# Patient Record
Sex: Female | Born: 1985 | Race: Asian | Hispanic: No | Marital: Married | Smoking: Never smoker
Health system: Southern US, Community
[De-identification: ages and names within clinical notes are randomized; demographics above are authoritative.]

## PROBLEM LIST (undated history)

## (undated) ENCOUNTER — Inpatient Hospital Stay (HOSPITAL_COMMUNITY): Payer: Self-pay

## (undated) DIAGNOSIS — Z789 Other specified health status: Secondary | ICD-10-CM

## (undated) HISTORY — PX: NO PAST SURGERIES: SHX2092

---

## 2012-07-21 LAB — OB RESULTS CONSOLE GC/CHLAMYDIA: Gonorrhea: NEGATIVE

## 2012-07-21 LAB — OB RESULTS CONSOLE VARICELLA ZOSTER ANTIBODY, IGG: Varicella: IMMUNE

## 2012-07-21 LAB — OB RESULTS CONSOLE HIV ANTIBODY (ROUTINE TESTING): HIV: NONREACTIVE

## 2012-07-23 NOTE — L&D Delivery Note (Signed)
Agree with above note.  Leavy Heatherly H. 12/04/2012 2:06 PM

## 2012-07-23 NOTE — L&D Delivery Note (Signed)
Delivery Note Pt progressed quickly to complete and pushed well and at 3:01 AM a viable female was delivered via Vaginal, Spontaneous Delivery (Presentation: Right Occiput Anterior).  APGAR: 8, 9; weight: pending.  Infant dried and lifted to pt's abd. Cord clamped and cut by FOB. Hospital cord blood sample collected. Placenta status: Intact, Spontaneous.  Cord: 3 vessels.  Anesthesia: Local  Episiotomy: None Lacerations: 1st degree;Vaginal Suture Repair: 3.0 vicryl Est. Blood Loss (mL): 250  Mom to postpartum.  Baby to nursery-stable.  Amber Le 11/29/2012, 3:30 AM

## 2012-07-31 ENCOUNTER — Other Ambulatory Visit (HOSPITAL_COMMUNITY): Payer: Self-pay | Admitting: Family

## 2012-07-31 DIAGNOSIS — Z0489 Encounter for examination and observation for other specified reasons: Secondary | ICD-10-CM

## 2012-08-01 ENCOUNTER — Ambulatory Visit (HOSPITAL_COMMUNITY)
Admission: RE | Admit: 2012-08-01 | Discharge: 2012-08-01 | Disposition: A | Discharge status: Home / Self Care | Payer: Medicaid Other | Source: Ambulatory Visit | Attending: Family | Admitting: Family

## 2012-08-01 ENCOUNTER — Encounter (HOSPITAL_COMMUNITY): Payer: Self-pay

## 2012-08-01 DIAGNOSIS — O358XX0 Maternal care for other (suspected) fetal abnormality and damage, not applicable or unspecified: Secondary | ICD-10-CM | POA: Insufficient documentation

## 2012-08-01 DIAGNOSIS — O0990 Supervision of high risk pregnancy, unspecified, unspecified trimester: Secondary | ICD-10-CM | POA: Insufficient documentation

## 2012-08-01 DIAGNOSIS — Z0489 Encounter for examination and observation for other specified reasons: Secondary | ICD-10-CM

## 2012-08-01 DIAGNOSIS — Z1389 Encounter for screening for other disorder: Secondary | ICD-10-CM | POA: Insufficient documentation

## 2012-08-01 DIAGNOSIS — Z363 Encounter for antenatal screening for malformations: Secondary | ICD-10-CM | POA: Insufficient documentation

## 2012-08-01 NOTE — Progress Notes (Signed)
Genetic Counseling  High-Risk Gestation Note  Appointment Date:  08/01/2012 Referred By: Jerrell Belfast, FNP Date of Birth:  1986/07/21    Pregnancy History: G1P0 Estimated Date of Delivery: 11/28/12 Estimated Gestational Age: [redacted]w[redacted]d Attending: Rema Fendt, MD  I met with Amber Le for genetic counseling because of a paternal age of 29 years. Interpretation was provided by [facility] interpreter Amber Le.  Both family histories were reviewed in detail and were contributory for the patient's paternal uncle being unable to speak. He reportedly has normal hearing and is able to communicate with gestures. He reportedly has never been able to speak and an underlying cause is not known. No additional relatives were reported with absent speech, including his children. We discussed that absent speech can be one feature of an underlying genetic condition. In the case of an underlying genetic cause, recurrence risk for relatives would depend upon the pattern of inheritance. Recurrence risk for the current pregnancy would likely be low given the degree of relation and no additional affected relatives. However, an accurate recurrence risk assessment cannot be provided without additional information regarding the underlying cause for her uncle's absent speech. The family histories were otherwise unremarkable for birth defects, mental retardation, known genetic conditions, recurrent pregnancy loss, or consanguinity.  Without further information regarding the provided family history, an accurate genetic risk cannot be calculated. Further genetic counseling is warranted if more information is obtained.  Amber Le was counseled that advanced paternal age (APA) is defined as paternal age greater than or equal to age 71.  Recent large-scale sequencing studies have shown that approximately 80% of de novo point mutations are of paternal origin.  Many studies have demonstrated a strong correlation between increased paternal  age and de novo point mutations.  Although no specific data is available regarding fetal risks for fathers 76+ years old at conception, it is apparent that the overall risk for single gene conditions is increased.  To estimate the relative increase in risk of a genetic disorder with APA, the heritability of the disease must be considered.  Assuming an approximate 2x increase in risk for conditions that are exclusively paternal in origin, the risk for each individual condition is still relatively low. We discussed that it is estimated that the overall chance for a de novo mutation is ~0.5%.  We also discussed the wide range of conditions which can be caused by new dominant gene mutations (achondroplasia, neurofibromatosis, Marfan syndrome etc.). She was counseled that genetic testing for each individual single gene condition is not warranted or available unless ultrasound or family history concerns lend suspicion to a specific condition.    In addition, she was counseled that some literature suggests that APA is also associated with an increase in risk for fetal aneuploidy.  While other literature does not support this, we discussed that a specific risk for aneuploidy other than that based on maternal age cannot be quantified. We reviewed that given the patient's age alone, the pregnancy would not be considered to be at increased risk for fetal aneuploidy. We discussed available screening and diagnostic options for fetal aneuploidy.  Specifically, we discussed the option of noninvasive prenatal testing (NIPT) and targeted ultrasound.  We discussed that NIPT utilizes cell free fetal DNA found in the maternal circulation. This test is not diagnostic for chromosome conditions, but can provide information regarding the presence or absence of extra fetal DNA for chromosomes 13, 18 and 21. Thus, it would not identify or rule out all genetic conditions. The  reported detection rate is greater than 99% for Trisomy 21,  greater than 97% for Trisomy 18, and is approximately 80% (8 out of 10) for Trisomy 13. The false positive rate is thought to be less than 0.1% for any of these conditions.  In addition, we discussed that ~50-80% of fetuses with Down syndrome and up to 90-95% of fetuses with trisomy 18/13, when well visualized, have detectable anomalies or soft markers by detailed ultrasound (~18+ weeks gestation).   After careful consideration, Amber Le elected to proceed with targeted ultrasound only and declined NIPT. Targeted ultrasound was performed at the time of today's visit. Visualized fetal anatomy appeared normal. Complete ultrasound results reported separately.   Amber Le denied exposure to environmental toxins or chemical agents. She denied the use of alcohol, tobacco or street drugs. She denied significant viral illnesses during the course of her pregnancy. Her medical and surgical histories were noncontributory.   I counseled Amber Le for approximately 30 minutes regarding the above risks and available options.    Amber Plowman, MS Certified Genetic Counselor 08/01/2012

## 2012-08-01 NOTE — Progress Notes (Signed)
Amber Le was seen for ultrasound appointment today.  Please see [facility] report for details.

## 2012-08-04 NOTE — Addendum Note (Signed)
Encounter addended by: Kadian Barcellos E Josimar Corning, RN on: 08/04/2012 10:12 AM<BR>     Documentation filed: Charges VN

## 2012-08-18 LAB — OB RESULTS CONSOLE ABO/RH

## 2012-08-18 LAB — OB RESULTS CONSOLE ANTIBODY SCREEN: Antibody Screen: NEGATIVE

## 2012-11-01 LAB — OB RESULTS CONSOLE GBS: GBS: NEGATIVE

## 2012-11-28 ENCOUNTER — Inpatient Hospital Stay (HOSPITAL_COMMUNITY)
Admission: AD | Admit: 2012-11-28 | Discharge: 2012-12-01 | DRG: 775 | Disposition: A | Discharge status: Home / Self Care | Payer: Medicaid Other | Source: Ambulatory Visit | Attending: Obstetrics & Gynecology | Admitting: Obstetrics & Gynecology

## 2012-11-28 HISTORY — DX: Other specified health status: Z78.9

## 2012-11-28 NOTE — MAU Note (Signed)
Contractions since 1000. Some pink vag d/c. Leaking fld all day.

## 2012-11-29 ENCOUNTER — Encounter (HOSPITAL_COMMUNITY): Payer: Self-pay | Admitting: *Deleted

## 2012-11-29 ENCOUNTER — Inpatient Hospital Stay (HOSPITAL_COMMUNITY): Admission: AD | Admit: 2012-11-29 | Payer: Self-pay | Source: Ambulatory Visit | Admitting: Obstetrics & Gynecology

## 2012-11-29 LAB — CBC
Hemoglobin: 13.3 g/dL (ref 12.0–15.0)
MCH: 27.7 pg (ref 26.0–34.0)
RBC: 4.81 MIL/uL (ref 3.87–5.11)
WBC: 13.7 10*3/uL — ABNORMAL HIGH (ref 4.0–10.5)

## 2012-11-29 LAB — TYPE AND SCREEN: Antibody Screen: NEGATIVE

## 2012-11-29 LAB — RPR: RPR Ser Ql: NONREACTIVE

## 2012-11-29 MED ORDER — FENTANYL CITRATE 0.05 MG/ML IJ SOLN
100.0000 ug | INTRAMUSCULAR | Status: DC | PRN
  Administered 2012-11-29: 100 ug via INTRAVENOUS
  Filled 2012-11-29: qty 2

## 2012-11-29 MED ORDER — DIBUCAINE 1 % RE OINT: 1.0000 "application " | TOPICAL_OINTMENT | RECTAL | Status: DC | PRN

## 2012-11-29 MED ORDER — NALBUPHINE SYRINGE 5 MG/0.5 ML
10.0000 mg | INJECTION | INTRAMUSCULAR | Status: DC | PRN
  Filled 2012-11-29: qty 1

## 2012-11-29 MED ORDER — BENZOCAINE-MENTHOL 20-0.5 % EX AERO: 1.0000 "application " | INHALATION_SPRAY | CUTANEOUS | Status: DC | PRN

## 2012-11-29 MED ORDER — DIPHENHYDRAMINE HCL 25 MG PO CAPS: 25.0000 mg | ORAL_CAPSULE | Freq: Four times a day (QID) | ORAL | Status: DC | PRN

## 2012-11-29 MED ORDER — ZOLPIDEM TARTRATE 5 MG PO TABS: 5.0000 mg | ORAL_TABLET | Freq: Every evening | ORAL | Status: DC | PRN

## 2012-11-29 MED ORDER — IBUPROFEN 600 MG PO TABS
600.0000 mg | ORAL_TABLET | Freq: Four times a day (QID) | ORAL | Status: DC | PRN
  Administered 2012-11-29: 600 mg via ORAL
  Filled 2012-11-29: qty 1

## 2012-11-29 MED ORDER — CITRIC ACID-SODIUM CITRATE 334-500 MG/5ML PO SOLN: 30.0000 mL | ORAL | Status: DC | PRN

## 2012-11-29 MED ORDER — OXYTOCIN BOLUS FROM INFUSION
500.0000 mL | INTRAVENOUS | Status: DC
  Administered 2012-11-29: 500 mL via INTRAVENOUS

## 2012-11-29 MED ORDER — LACTATED RINGERS IV SOLN
INTRAVENOUS | Status: DC
  Administered 2012-11-29: 125 mL/h via INTRAVENOUS

## 2012-11-29 MED ORDER — ONDANSETRON HCL 4 MG PO TABS: 4.0000 mg | ORAL_TABLET | ORAL | Status: DC | PRN

## 2012-11-29 MED ORDER — FLEET ENEMA 7-19 GM/118ML RE ENEM: 1.0000 | ENEMA | RECTAL | Status: DC | PRN

## 2012-11-29 MED ORDER — OXYCODONE-ACETAMINOPHEN 5-325 MG PO TABS
1.0000 | ORAL_TABLET | ORAL | Status: DC | PRN
Start: 2012-11-29 — End: 2012-12-01

## 2012-11-29 MED ORDER — SIMETHICONE 80 MG PO CHEW: 80.0000 mg | CHEWABLE_TABLET | ORAL | Status: DC | PRN

## 2012-11-29 MED ORDER — ONDANSETRON HCL 4 MG/2ML IJ SOLN: 4.0000 mg | INTRAMUSCULAR | Status: DC | PRN

## 2012-11-29 MED ORDER — ACETAMINOPHEN 325 MG PO TABS: 650.0000 mg | ORAL_TABLET | ORAL | Status: DC | PRN

## 2012-11-29 MED ORDER — PRENATAL MULTIVITAMIN CH
1.0000 | ORAL_TABLET | Freq: Every day | ORAL | Status: DC
  Administered 2012-11-29 – 2012-11-30 (×2): 1 via ORAL
  Filled 2012-11-29 (×3): qty 1

## 2012-11-29 MED ORDER — TETANUS-DIPHTH-ACELL PERTUSSIS 5-2.5-18.5 LF-MCG/0.5 IM SUSP: 0.5000 mL | Freq: Once | INTRAMUSCULAR | Status: DC

## 2012-11-29 MED ORDER — OXYCODONE-ACETAMINOPHEN 5-325 MG PO TABS
1.0000 | ORAL_TABLET | ORAL | Status: DC | PRN
  Administered 2012-11-29: 1 via ORAL
  Filled 2012-11-29: qty 1

## 2012-11-29 MED ORDER — LACTATED RINGERS IV SOLN: 500.0000 mL | INTRAVENOUS | Status: DC | PRN

## 2012-11-29 MED ORDER — WITCH HAZEL-GLYCERIN EX PADS: 1.0000 "application " | MEDICATED_PAD | CUTANEOUS | Status: DC | PRN

## 2012-11-29 MED ORDER — ONDANSETRON HCL 4 MG/2ML IJ SOLN: 4.0000 mg | Freq: Four times a day (QID) | INTRAMUSCULAR | Status: DC | PRN

## 2012-11-29 MED ORDER — IBUPROFEN 600 MG PO TABS
600.0000 mg | ORAL_TABLET | Freq: Four times a day (QID) | ORAL | Status: DC
  Administered 2012-11-29 – 2012-12-01 (×8): 600 mg via ORAL
  Filled 2012-11-29 (×9): qty 1

## 2012-11-29 MED ORDER — SENNOSIDES-DOCUSATE SODIUM 8.6-50 MG PO TABS
2.0000 | ORAL_TABLET | Freq: Every day | ORAL | Status: DC
  Administered 2012-11-29 – 2012-11-30 (×2): 2 via ORAL

## 2012-11-29 MED ORDER — LIDOCAINE HCL (PF) 1 % IJ SOLN
30.0000 mL | INTRAMUSCULAR | Status: AC | PRN
  Administered 2012-11-29: 30 mL via SUBCUTANEOUS
  Filled 2012-11-29 (×2): qty 30

## 2012-11-29 MED ORDER — OXYTOCIN 40 UNITS IN LACTATED RINGERS INFUSION - SIMPLE MED
62.5000 mL/h | INTRAVENOUS | Status: DC
  Filled 2012-11-29: qty 1000

## 2012-11-29 MED ORDER — LANOLIN HYDROUS EX OINT: TOPICAL_OINTMENT | CUTANEOUS | Status: DC | PRN

## 2012-11-29 NOTE — Progress Notes (Signed)
Report called to Dana RN in BS. Pt to BS via w/c 

## 2012-11-29 NOTE — H&P (Signed)
Amber Le is a 27 y.o. female G1P0 at 39.1wks presenting for eval of ctx and possible leaking fluid. Denies N/V/D or any other complaints. She receives her Amber [facility] at the [facility] and her pregnancy has been remarkable for 1) recently moved here from [place] 2) paternal age 53- rec'd genetic counseling but declined testing 3) late to care at 25wks 4) GBS neg History OB History   Grav Para Term Preterm Abortions TAB SAB Ect Mult Living   1              Past Medical History  Diagnosis Date  . Medical history non-contributory    History reviewed. No pertinent past surgical history. Family History: family history is negative for Alcohol abuse. Social History:  reports that she has never smoked. She does not have any smokeless tobacco history on file. She reports that she does not drink alcohol or use illicit drugs.   Prenatal Transfer Tool  Maternal Diabetes: No Genetic Screening: Declined Maternal Ultrasounds/Referrals: Normal Fetal Ultrasounds or other Referrals:  None Maternal Substance Abuse:  No Significant Maternal Medications:  None Significant Maternal Lab Results:  Lab values include: Group B Strep negative Other Comments:  None  ROS  Dilation: 4 Effacement (%): 90 Station: -1 Exam by:: Amber Le CNM Blood pressure 128/87, pulse 89, resp. rate 20, height 4' 11.5" (1.511 m), weight 164 lb 13.9 oz (74.785 kg), last menstrual period 02/22/2012, SpO2 100.00%. Exam Physical Exam  Constitutional: She is oriented to person, place, and time. She appears well-developed.  HENT:  Head: Normocephalic.  Neck: Normal range of motion.  Cardiovascular: Normal rate.   Respiratory: Effort normal.  GI:  FHR 150 + accels, no decels Ctx q 5 mins per toco  Genitourinary:  SSE showed no pooling but + bloody show  Musculoskeletal: Normal range of motion.  Neurological: She is alert and oriented to person, place, and time.  Skin: Skin is warm and dry.  Psychiatric: She has a normal mood and  affect. Her behavior is normal. Thought content normal.    Prenatal labs: ABO, Rh:  B+ Antibody:  neg Rubella:  imm RPR:   NR HBsAg:   neg HIV:   NR GBS:   neg  Assessment/Plan: IUP at 40.1wks Early labor GBS neg  Admit to [facility] Expectant management Anticipate SVD   Amber Le 11/29/2012, 12:27 AM

## 2012-11-29 NOTE — Progress Notes (Signed)
Spec exam done to r/o srom

## 2012-11-30 NOTE — Progress Notes (Signed)
Post Partum Day 1 Subjective: no complaints, up ad lib, voiding and tolerating PO  Objective: Blood pressure 109/73, pulse 94, temperature 97.8 F (36.6 C), temperature source Oral, resp. rate 18, height 4' 11.5" (1.511 m), weight 74.785 kg (164 lb 13.9 oz), last menstrual period 02/22/2012, SpO2 98.00%, unknown if currently breastfeeding.  Physical Exam:  General: alert, cooperative and no distress Lochia: appropriate Uterine Fundus: firm Incision: n/a DVT Evaluation: No evidence of DVT seen on physical exam. No cords or calf tenderness. 1+ ankle edema bilaterally   Recent Labs  11/29/12 0109  HGB 13.3  HCT 39.7    Assessment/Plan: Plan for discharge tomorrow, bottle feeding   LOS: 2 days   Napoleon Form 11/30/2012, 8:00 AM

## 2012-12-01 MED ORDER — IBUPROFEN 600 MG PO TABS: 600.0000 mg | ORAL_TABLET | Freq: Four times a day (QID) | ORAL | Status: DC | PRN

## 2012-12-01 NOTE — Progress Notes (Signed)
Ur chart review completed.  

## 2012-12-01 NOTE — Discharge Summary (Signed)
Pt. Seen and examined by me today.

## 2012-12-01 NOTE — Discharge Summary (Signed)
Obstetric Discharge Summary Reason for Admission: onset of labor Prenatal Procedures: none Intrapartum Procedures: spontaneous vaginal delivery Postpartum Procedures: none Complications-Operative and Postpartum: first degree vaginal laceration Hemoglobin  Date Value Range Status  11/29/2012 13.3  12.0 - 15.0 g/dL Final     HCT  Date Value Range Status  11/29/2012 39.7  36.0 - 46.0 % Final    Physical Exam:  General: alert, cooperative and no distress Lochia: appropriate Uterine Fundus: firm Incision: n/a DVT Evaluation: No evidence of DVT seen on physical exam. Negative Homan's sign. No cords or calf tenderness.  Hospital Course: Amber Le is a 27 y.o. G1P1001 at [redacted]w[redacted]d who was admitted to the hospital after presenting for onset of labor. She had a normal spontaneous vaginal delivery and an uncomplicated postpartum course.   Pt is breast feeding and plans to use condoms for contraception. She will follow up with the health department in 6 weeks.  Discharge Diagnoses: Term Pregnancy-delivered  Discharge Information: Date: 12/01/2012 Activity: pelvic rest Diet: routine Medications: PNV and Ibuprofen Condition: stable Instructions: refer to practice specific booklet Discharge to: home   Newborn Data: Live born female  Birth Weight: 8 lb 2.5 oz (3700 g) APGAR: 8, 9  Home with mother.  Levert Feinstein 12/01/2012, 8:05 AM

## 2012-12-04 NOTE — H&P (Signed)
Agree with above note.  Amber Le H. 12/04/2012 2:06 PM  

## 2012-12-09 ENCOUNTER — Other Ambulatory Visit: Payer: Self-pay | Admitting: Family Medicine

## 2014-05-24 ENCOUNTER — Encounter (HOSPITAL_COMMUNITY): Payer: Self-pay | Admitting: *Deleted

## 2015-02-24 ENCOUNTER — Encounter (HOSPITAL_COMMUNITY): Payer: Self-pay

## 2015-02-24 ENCOUNTER — Inpatient Hospital Stay (HOSPITAL_COMMUNITY)
Admission: AD | Admit: 2015-02-24 | Discharge: 2015-02-24 | Disposition: A | Discharge status: Home / Self Care | Payer: Medicaid Other | Source: Ambulatory Visit | Attending: Obstetrics & Gynecology | Admitting: Obstetrics & Gynecology

## 2015-02-24 DIAGNOSIS — Z3201 Encounter for pregnancy test, result positive: Secondary | ICD-10-CM | POA: Insufficient documentation

## 2015-02-24 DIAGNOSIS — Z3200 Encounter for pregnancy test, result unknown: Secondary | ICD-10-CM | POA: Diagnosis present

## 2015-02-24 LAB — POCT PREGNANCY, URINE: Preg Test, Ur: POSITIVE — AB

## 2015-02-24 NOTE — MAU Provider Note (Signed)
Amber Le is a 29 y.o. G2P1001 at [redacted]w[redacted]d who presents to MAU today for pregnancy verification. The patient denies abdominal pain or vaginal bleeding today  BP 110/76 mmHg  Pulse 85  Temp(Src) 98.4 F (36.9 C) (Oral)  Resp 18  LMP 01/15/2015 GENERAL: Well-developed, well-nourished female in no acute distress.  HEENT: Normocephalic, atraumatic.   LUNGS: Effort normal HEART: Regular rate  SKIN: Warm, dry and without erythema PSYCH: Normal mood and affect  Results for orders placed or performed during the hospital encounter of 02/24/15 (from the past 24 hour(s))  Pregnancy, urine POC     Status: Abnormal   Collection Time: 02/24/15  4:44 PM  Result Value Ref Range   Preg Test, Ur POSITIVE (A) NEGATIVE    A: Positive pregnancy test  P: Discharge home Pregnancy confirmation letter and list of area OB providers given. Patient plans to go to the [facility] First trimester warning signs reviewed Patient may return to MAU as needed or if her condition were to change or worsen   Marny Lowenstein, PA-C  02/24/2015 5:02 PM

## 2015-02-24 NOTE — Discharge Instructions (Signed)
Prenatal Care  WHAT IS PRENATAL CARE?  Prenatal care means health care during your pregnancy, before your baby is born. It is very important to take care of yourself and your baby during your pregnancy by:   Getting early prenatal care. If you know you are pregnant, or think you might be pregnant, call your health care provider as soon as possible. Schedule a visit for a prenatal exam.  Getting regular prenatal care. Follow your health care provider's schedule for blood and other necessary tests. Do not miss appointments.  Doing everything you can to keep yourself and your baby healthy during your pregnancy.  Getting complete care. Prenatal care should include evaluation of the medical, dietary, educational, psychological, and social needs of you and your significant other. The medical and genetic history of your family and the family of your baby's father should be discussed with your health care provider.  Discussing with your health care provider:  Prescription, over-the-counter, and herbal medicines that you take.  Any history of substance abuse, alcohol use, smoking, and illegal drug use.  Any history of domestic abuse and violence.  Immunizations you have received.  Your nutrition and diet.  The amount of exercise you do.  Any environmental and occupational hazards to which you are exposed.  History of sexually transmitted infections for both you and your partner.  Previous pregnancies you have had. WHY IS PRENATAL CARE SO IMPORTANT?  By regularly seeing your health care provider, you help ensure that problems can be identified early so that they can be treated as soon as possible. Other problems might be prevented. Many studies have shown that early and regular prenatal care is important for the health of mothers and their babies.  HOW CAN I TAKE CARE OF MYSELF WHILE I AM PREGNANT?  Here are ways to take care of yourself and your baby:   Start or continue taking your  multivitamin with 400 micrograms (mcg) of folic acid every day.  Get early and regular prenatal care. It is very important to see a health care provider during your pregnancy. Your health care provider will check at each visit to make sure that you and your baby are healthy. If there are any problems, action can be taken right away to help you and your baby.  Eat a healthy diet that includes:  Fruits.  Vegetables.  Foods low in saturated fat.  Whole grains.  Calcium-rich foods, such as milk, yogurt, and hard cheeses.  Drink 6-8 glasses of liquids a day.  Unless your health care provider tells you not to, try to be physically active for 30 minutes, most days of the week. If you are pressed for time, you can get your activity in through 10-minute segments, three times a day.  Do not smoke, drink alcohol, or use drugs. These can cause long-term damage to your baby. Talk with your health care provider about steps to take to stop smoking. Talk with a member of your faith community, a counselor, a trusted friend, or your health care provider if you are concerned about your alcohol or drug use.  Ask your health care provider before taking any medicine, even over-the-counter medicines. Some medicines are not safe to take during pregnancy.  Get plenty of rest and sleep.  Avoid hot tubs and saunas during pregnancy.  Do not have X-rays taken unless absolutely necessary and with the recommendation of your health care provider. A lead shield can be placed on your abdomen to protect your baby when   X-rays are taken in other parts of your body.  Do not empty the cat litter when you are pregnant. It may contain a parasite that causes an infection called toxoplasmosis, which can cause birth defects. Also, use gloves when working in garden areas used by cats.  Do not eat uncooked or undercooked meats or fish.  Do not eat soft, mold-ripened cheeses (Brie, Camembert, and chevre) or soft, blue-veined  cheese (Danish blue and Roquefort).  Stay away from toxic chemicals like:  Insecticides.  Solvents (some cleaners or paint thinners).  Lead.  Mercury.  Sexual intercourse may continue until the end of the pregnancy, unless you have a medical problem or there is a problem with the pregnancy and your health care provider tells you not to.  Do not wear high-heel shoes, especially during the second half of the pregnancy. You can lose your balance and fall.  Do not take long trips, unless absolutely necessary. Be sure to see your health care provider before going on the trip.  Do not sit in one position for more than 2 hours when on a trip.  Take a copy of your medical records when going on a trip. Know where a hospital is located in the city you are visiting, in case of an emergency.  Most dangerous household products will have pregnancy warnings on their labels. Ask your health care provider about products if you are unsure.  Limit or eliminate your caffeine intake from coffee, tea, sodas, medicines, and chocolate.  Many women continue working through pregnancy. Staying active might help you stay healthier. If you have a question about the safety or the hours you work at your particular job, talk with your health care provider.  Get informed:  Read books.  Watch videos.  Go to childbirth classes for you and your significant other.  Talk with experienced moms.  Ask your health care provider about childbirth education classes for you and your partner. Classes can help you and your partner prepare for the birth of your baby.  Ask about a baby doctor (pediatrician) and methods and pain medicine for labor, delivery, and possible cesarean delivery. HOW OFTEN SHOULD I SEE MY HEALTH CARE PROVIDER DURING PREGNANCY?  Your health care provider will give you a schedule for your prenatal visits. You will have visits more often as you get closer to the end of your pregnancy. An average  pregnancy lasts about 40 weeks.  A typical schedule includes visiting your health care provider:   About once each month during your first 6 months of pregnancy.  Every 2 weeks during the next 2 months.  Weekly in the last month, until the delivery date. Your health care provider will probably want to see you more often if:  You are older than 35 years.  Your pregnancy is high risk because you have certain health problems or problems with the pregnancy, such as:  Diabetes.  High blood pressure.  The baby is not growing on schedule, according to the dates of the pregnancy. Your health care provider will do special tests to make sure you and your baby are not having any serious problems. WHAT HAPPENS DURING PRENATAL VISITS?   At your first prenatal visit, your health care provider will do a physical exam and talk to you about your health history and the health history of your partner and your family. Your health care provider will be able to tell you what date to expect your baby to be born on.  Your   first physical exam will include checks of your blood pressure, measurements of your height and weight, and an exam of your pelvic organs. Your health care provider will do a Pap test if you have not had one recently and will do cultures of your cervix to make sure there is no infection.  At each prenatal visit, there will be tests of your blood, urine, blood pressure, weight, and the progress of the baby will be checked.  At your later prenatal visits, your health care provider will check how you are doing and how your baby is developing. You may have a number of tests done as your pregnancy progresses.  Ultrasound exams are often used to check on your baby's growth and health.  You may have more urine and blood tests, as well as special tests, if needed. These may include amniocentesis to examine fluid in the pregnancy sac, stress tests to check how the baby responds to contractions, or a  biophysical profile to measure your baby's well-being. Your health care provider will explain the tests and why they are necessary.  You should be tested for high blood sugar (gestational diabetes) between the 24th and 28th weeks of your pregnancy.  You should discuss with your health care provider your plans to breastfeed or bottle-feed your baby.  Each visit is also a chance for you to learn about staying healthy during pregnancy and to ask questions. Document Released: 07/12/2003 Document Revised: 07/14/2013 Document Reviewed: 09/23/2013 Exit[employer] Patient Information 2015 [employer]. This information is not intended to replace advice given to you by your health care provider. Make sure you discuss any questions you have with your health care provider.  

## 2015-02-24 NOTE — MAU Note (Signed)
Pt presents for confirmation of pregnancy. Denies pain or vaginal bleeding. LMP 01/15/2015

## 2015-03-05 ENCOUNTER — Encounter (HOSPITAL_COMMUNITY): Payer: Self-pay | Admitting: *Deleted

## 2015-03-05 ENCOUNTER — Inpatient Hospital Stay (HOSPITAL_COMMUNITY)
Admission: AD | Admit: 2015-03-05 | Discharge: 2015-03-05 | Disposition: A | Discharge status: Home / Self Care | Payer: Medicaid Other | Source: Ambulatory Visit | Attending: Obstetrics & Gynecology | Admitting: Obstetrics & Gynecology

## 2015-03-05 ENCOUNTER — Inpatient Hospital Stay (HOSPITAL_COMMUNITY): Payer: Medicaid Other

## 2015-03-05 DIAGNOSIS — O209 Hemorrhage in early pregnancy, unspecified: Secondary | ICD-10-CM | POA: Diagnosis not present

## 2015-03-05 DIAGNOSIS — O4691 Antepartum hemorrhage, unspecified, first trimester: Secondary | ICD-10-CM | POA: Diagnosis not present

## 2015-03-05 DIAGNOSIS — B3731 Acute candidiasis of vulva and vagina: Secondary | ICD-10-CM

## 2015-03-05 DIAGNOSIS — R109 Unspecified abdominal pain: Secondary | ICD-10-CM | POA: Diagnosis present

## 2015-03-05 DIAGNOSIS — Z3A01 Less than 8 weeks gestation of pregnancy: Secondary | ICD-10-CM | POA: Insufficient documentation

## 2015-03-05 DIAGNOSIS — O98811 Other maternal infectious and parasitic diseases complicating pregnancy, first trimester: Secondary | ICD-10-CM | POA: Diagnosis not present

## 2015-03-05 DIAGNOSIS — B373 Candidiasis of vulva and vagina: Secondary | ICD-10-CM | POA: Diagnosis not present

## 2015-03-05 LAB — CBC WITH DIFFERENTIAL/PLATELET
Basophils Absolute: 0 10*3/uL (ref 0.0–0.1)
Basophils Relative: 1 % (ref 0–1)
EOS ABS: 0.6 10*3/uL (ref 0.0–0.7)
Eosinophils Relative: 8 % — ABNORMAL HIGH (ref 0–5)
HEMATOCRIT: 36.8 % (ref 36.0–46.0)
HEMOGLOBIN: 12.6 g/dL (ref 12.0–15.0)
LYMPHS ABS: 1.3 10*3/uL (ref 0.7–4.0)
LYMPHS PCT: 16 % (ref 12–46)
MCH: 28.8 pg (ref 26.0–34.0)
MCHC: 34.2 g/dL (ref 30.0–36.0)
MCV: 84.2 fL (ref 78.0–100.0)
MONOS PCT: 6 % (ref 3–12)
Monocytes Absolute: 0.5 10*3/uL (ref 0.1–1.0)
NEUTROS ABS: 5.5 10*3/uL (ref 1.7–7.7)
Neutrophils Relative %: 69 % (ref 43–77)
PLATELETS: 240 10*3/uL (ref 150–400)
RBC: 4.37 MIL/uL (ref 3.87–5.11)
RDW: 12.9 % (ref 11.5–15.5)
WBC: 7.9 10*3/uL (ref 4.0–10.5)

## 2015-03-05 LAB — WET PREP, GENITAL
CLUE CELLS WET PREP: NONE SEEN
TRICH WET PREP: NONE SEEN

## 2015-03-05 MED ORDER — TERCONAZOLE 0.4 % VA CREA: 1.0000 | TOPICAL_CREAM | Freq: Every day | VAGINAL | Status: DC

## 2015-03-05 NOTE — MAU Provider Note (Signed)
History     CSN: [id number]  Arrival date and time: 03/05/15 4098   First Provider Initiated Contact with Patient 03/05/15 [phone]      Chief Complaint  Patient presents with  . Vaginal Bleeding   HPI Amber Le 29 y.o. G2P1001 @[redacted]w[redacted]d  presents to MAU complaining of abdominal pain and vaginal bleeding.  It started yesterday.  The pain is moderate.  The bleeding is light.  She denies nausea, vomiting, weakness, fever, SOB.  She admits to dysuria.   OB History    Gravida Para Term Preterm AB TAB SAB Ectopic Multiple Living   2 1 1       1       Past Medical History  Diagnosis Date  . Medical history non-contributory     Past Surgical History  Procedure Laterality Date  . No past surgeries      Family History  Problem Relation Age of Onset  . Alcohol abuse Neg Hx     Social History  Substance Use Topics  . Smoking status: Never Smoker   . Smokeless tobacco: None  . Alcohol Use: No    Allergies: No Known Allergies  Prescriptions prior to admission  Medication Sig Dispense Refill Last Dose  . Prenatal Vit-Fe Fumarate-FA (PRENATAL MULTIVITAMIN) TABS Take 1 tablet by mouth daily.   Past Week at Unknown time    ROS Pertinent ROS in HPI.  All other systems are negative.   Physical Exam   Blood pressure 101/58, pulse 88, last menstrual period 01/15/2015, unknown if currently breastfeeding.  Physical Exam  Constitutional: She is oriented to person, place, and time. She appears well-developed and well-nourished. She appears distressed.  HENT:  Head: Normocephalic and atraumatic.  Eyes: EOM are normal.  Neck: Normal range of motion.  Cardiovascular: Normal rate.   Respiratory: Breath sounds normal. No respiratory distress.  GI: Soft. There is no tenderness.  Genitourinary:  Mod amt of thick white adherent vag discharge No CMT, No adnexal mass or tenderness  Musculoskeletal: Normal range of motion.  Neurological: She is alert and oriented to person, place, and time.   Skin: Skin is warm and dry.  Psychiatric: She has a normal mood and affect.    MAU Course  Procedures  MDM Results for orders placed or performed during the hospital encounter of 03/05/15 (from the past 24 hour(s))  Wet prep, genital     Status: Abnormal   Collection Time: 03/05/15  8:32 AM  Result Value Ref Range   Yeast Wet Prep HPF POC RARE (A) NONE SEEN   Trich, Wet Prep NONE SEEN NONE SEEN   Clue Cells Wet Prep HPF POC NONE SEEN NONE SEEN   WBC, Wet Prep HPF POC RARE (A) NONE SEEN  CBC with Differential/Platelet     Status: Abnormal   Collection Time: 03/05/15  8:45 AM  Result Value Ref Range   WBC 7.9 4.0 - 10.5 K/uL   RBC 4.37 3.87 - 5.11 MIL/uL   Hemoglobin 12.6 12.0 - 15.0 g/dL   HCT 47.8 29.5 - 62.1 %   MCV 84.2 78.0 - 100.0 fL   MCH 28.8 26.0 - 34.0 pg   MCHC 34.2 30.0 - 36.0 g/dL   RDW 30.8 65.7 - 84.6 %   Platelets 240 150 - 400 K/uL   Neutrophils Relative % 69 43 - 77 %   Neutro Abs 5.5 1.7 - 7.7 K/uL   Lymphocytes Relative 16 12 - 46 %   Lymphs Abs 1.3 0.7 - 4.0  K/uL   Monocytes Relative 6 3 - 12 %   Monocytes Absolute 0.5 0.1 - 1.0 K/uL   Eosinophils Relative 8 (H) 0 - 5 %   Eosinophils Absolute 0.6 0.0 - 0.7 K/uL   Basophils Relative 1 0 - 1 %   Basophils Absolute 0.0 0.0 - 0.1 K/uL   US Ob Comp Less 14 Wks  03/05/2015   CLINICAL DATA:  Bleeding, cramping since 03/04/2015  EXAM: OBSTETRIC <14 WK [place] AND TRANSVAGINAL OB US  TECHNIQUE: Both transabdominal and transvaginal ultrasound examinations were performed for complete evaluation of the gestation as well as the maternal uterus, adnexal regions, and pelvic cul-de-sac. Transvaginal technique was performed to assess early pregnancy.  COMPARISON:  None.  FINDINGS: Intrauterine gestational sac: Visualized/normal in shape.  Yolk sac:  Present  Embryo:  Present  Cardiac Activity: Present  Heart Rate: 122  bpm  CRL:  4.6  mm   6 w   2 d                  [place] EDC: 10/27/2014  Maternal uterus/adnexae: Possible small  subchorionic hemorrhage. Normal bilateral ovaries. No adnexal mass. No pelvic free fluid.  IMPRESSION: 1. Single live intrauterine pregnancy as detailed above. 2. Possible small subchorionic hemorrhage versus artifact. Attention on follow-up examinations is recommended.   Electronically Signed   By: Elige Ko   On: 03/05/2015 11:05   US Ob Transvaginal  03/05/2015   CLINICAL DATA:  Bleeding, cramping since 03/04/2015  EXAM: OBSTETRIC <14 WK [place] AND TRANSVAGINAL OB US  TECHNIQUE: Both transabdominal and transvaginal ultrasound examinations were performed for complete evaluation of the gestation as well as the maternal uterus, adnexal regions, and pelvic cul-de-sac. Transvaginal technique was performed to assess early pregnancy.  COMPARISON:  None.  FINDINGS: Intrauterine gestational sac: Visualized/normal in shape.  Yolk sac:  Present  Embryo:  Present  Cardiac Activity: Present  Heart Rate: 122  bpm  CRL:  4.6  mm   6 w   2 d                  [place] EDC: 10/27/2014  Maternal uterus/adnexae: Possible small subchorionic hemorrhage. Normal bilateral ovaries. No adnexal mass. No pelvic free fluid.  IMPRESSION: 1. Single live intrauterine pregnancy as detailed above. 2. Possible small subchorionic hemorrhage versus artifact. Attention on follow-up examinations is recommended.   Electronically Signed   By: Elige Ko   On: 03/05/2015 11:05   Pt is hemodynamically stable Yeast noted on wet prep Viable IUP confirmed with possible subchorionic  Assessment and Plan  A:  1. Vulvovaginal candidiasis   2. Vaginal bleeding in pregnancy, first trimester    P: Discharge to home Pelvic rest Terazole vaginal cream x 1 week F/u HD for [facility] asap PNV qd Patient may return to MAU as needed or if her condition were to change or worsen   Bertram Denver 03/05/2015, 8:25 AM

## 2015-03-05 NOTE — MAU Note (Signed)
Pt started having some vaginal bleeding last night as well as some lower abdominal cramping that started yesterday as well.

## 2015-03-05 NOTE — Discharge Instructions (Signed)
Vim m ??o (Vaginitis) Vim m ??o l hi?n t??ng m ??o b? vim. B?Le ny th??ng gy ra b?i s? thay ??i trong cn b?ng bnh th??ng gi?a vi khu?n v n?m men trong m ??o. S? thay ??i cn b?ng ny gy ra s? pht tri?n qu m?c c?a m?t s? vi khu?n ho?c n?m men Le?t ??Le gy vim. C nhi?u lo?i vim m ??o khc nhau, Le?ng Le?ng lo?i ph? bi?n Le?t l:   Nhi?m khu?n m ??o.  Nhi?m n?m men (candida).  Vim m ??o trichomonas. ?y l b?Le nhi?m trng ly truy?n qua ???ng tnh d?c (STI).  Vim m ??o do vi rt.  Vim m ??o teo.  Vim m ??o d? ?ng. NGUYN NHN Nguyn nhn ty thu?c vo lo?i vim m ??o. Vim m ??o c th? gy ra b?i:  Vi khu?n (nhi?m khu?n m ??o).  N?m men (nhi?m n?m men).  K sinh trng (vim m ??o trichomonas)  Vi rt (vim m ??o do vi rt).  M?c hocmon th?p (vim m ??o teo). M?c hocmon th?p c th? x?y ra trong khi mang thai, cho con b ho?c sau khi mn kinh.  Ch?t kch thch, ch?ng h?n Le? t?m b?t, b?ng v? sinh c mi th?m v bnh x?t n? (vim m ??o d? ?ng). Cc y?u t? khc c th? thay ??i cn b?ng bnh th??ng c?a n?m men v vi khu?n trong m ??o. Cc y?u t? ny bao g?m:  Thu?c khng sinh.  V? sinh khng t?t.  Mng ch?n, b?t bi?n m ??o, ch?t di?t tinh trng, thu?c ng?a [place] v d?ng c? t? cung (IUD).  Quan h? tnh d?c.  Nhi?m trng.  B?Le ti?u ???ng khng ???c ki?m sot.  H? th?ng mi?n d?ch b? suy y?u. TRI?U CH?NG Tri?u ch?ng c th? khc nhau, ty thu?c vo nguyn nhn gy vim m ??o. Cc tri?u ch?ng ph? bi?n bao g?m:  Ra kh h? b?t th??ng ? m ??o.  D?ch c mu tr?ng, xm ho?c vng v?i nhi?m khu?n m ??o.  D?ch dy, c mu tr?ng v c mi ph mt v?i nhi?m n?m men.  D?ch c b?t v mu vng ho?c xanh v?i trichomonas.  m ??o c mi hi.  Mi tanh v?i nhi?m khu?n m ??o.  Ng?a, ?au ho?c s?ng m ??o.  ?au khi giao h?p.  ?au ho?c rt khi ?i ti?u. ?i khi khng c tri?u ch?ng. ?I?U TR? Ph??ng php ?i?u tr? ty thu?c vo lo?i nhi?m  trng.  Nhi?m khu?n m ??o v trichomonas th??ng ???c ?i?u tr? b?ng cc d?ng kem ho?c thu?c vin khng sinh.  Nhi?m n?m men th??ng ???c ?i?u tr? b?ng thu?c ch?ng n?m, ch?ng h?n Le? kem ho?c thu?c ??n m ??o.  Vim m ??o do vi rt khng c cch ch?a kh?i hon ton, Le?ng tri?u ch?ng c th? ???c ?i?u tr? b?ng thu?c lm gi?m c?m gic kh ch?u. B?n tnh c?a b?n c?ng c?n ???c ?i?u tr?.  Vim m ??o teo c th? ???c ?i?u tr? b?ng kem, thu?c vin, thu?c ??n estrogen ho?c vng m ??o. N?u m ??o b? kh, cc ch?t bi tr?n v kem d??ng ?m c th? gip ??. B?n c th? ???c h??ng d?n trnh x phng th?m, bnh x?t ho?c th?t r?a m ??o.  ?i?u tr? vim m ??o d? ?ng bao g?m b? s? d?ng s?n ph?m gy ra v?n ??. Cc lo?i kem m ??o c th? ???c s? d?ng ?? ?i?u tr? cc tri?u ch?ng. H??NG D?N CH?M  Amber Le  S? d?ng t?t c? thu?c theo ch? d?n c?a chuyn gia ch?m Amber Le s?c kh?e.  Gi? vng b? ph?n sinh d?c s?ch v kh. Trnh x phng v ch? r?a vng ny b?ng n??c.  Trnh th?t r?a. N c th? lo?i b? cc vi khu?n c l?i trong m ??o.  Khng s? d?ng b?ng v? sinh ho?c c quan h? tnh d?c cho ??n khi vim m ??o ???c ?i?u tr?. S? d?ng mi?ng dn v? sinh trong khi b? vim m ??o.  Lau t? tr??c ra sau. Lm Le? v?y ?? trnh ly lan vi khu?n t? tr?c trng sang m ??o.  ?? khng kh ??n vng b? ph?n sinh d?c c?a b?n.  M?c ?? lt b?ng s?i bng ?? lm gi?m s? tch t? h?i ?m.  Trnh m?c ?? lt trong khi ng? cho ??n khi h?t vim m ??o.  Trnh qu?n v ?? lt b ch?t ho?c ?? nylon m khng c t?m lt b?ng s?i bng.  C?i b? qu?n o ??t (??c bi?t l qu?n o t?m) cng s?m cng t?t.  S? d?ng cc s?n ph?m Le?, khng c mi th?m. Amber Le s? d?ng cc ch?t kch thch, ch?ng h?n Le?:  Bnh x?t th?m n?.  N??c x? v?i.  Ch?t t?y r?a c mi th?m.  B?ng v? sinh c mi th?m.  X phng th?m ho?c t?m b?t.  Th?c hnh tnh d?c an ton v s? d?ng bao cao su. Amber Le su c th? ng?n ng?a ly vim m ??o trichomonas v vim m ??o do vi  rt. HY ?I KHM N?U:  B?n b? ?au b?ng.  B?n b? s?t ho?c c cc tri?u ch?ng ko di h?n 2-3 ngy.  B?n b? s?t v cc tri?u ch?ng c?a b?n ??t nhin x?u ?i. Document Released: 04/02/2012 Document Revised: 03/11/2013 [facility] Patient Information 2015 [place], [place]. This information is not intended to replace advice given to you by your health care provider. Make sure you discuss any questions you have with your health care provider.  Pelvic Rest Pelvic rest is sometimes recommended for women when:   The placenta is partially or completely covering the opening of the cervix (placenta previa).  There is bleeding between the uterine wall and the amniotic sac in the first trimester (subchorionic hemorrhage).  The cervix begins to open without labor starting (incompetent cervix, cervical insufficiency).  The labor is too early (preterm labor). HOME CARE INSTRUCTIONS  Do not have sexual intercourse, stimulation, or an orgasm.  Do not use tampons, douche, or put anything in the vagina.  Do not lift anything over 10 pounds (4.5 kg).  Avoid strenuous activity or straining your pelvic muscles. SEEK MEDICAL CARE IF:  You have any vaginal bleeding during pregnancy. Treat this as a potential emergency.  You have cramping pain felt low in the stomach (stronger than menstrual cramps).  You notice vaginal discharge (watery, mucus, or bloody).  You have a low, dull backache.  There are regular contractions or uterine tightening. SEEK IMMEDIATE MEDICAL CARE IF: You have vaginal bleeding and have placenta previa.  Document Released: 11/03/2010 Document Revised: 10/01/2011 Document Reviewed: 11/03/2010 [facility] Patient Information 2015 [place], [place]. This information is not intended to replace advice given to you by your health care provider. Make sure you discuss any questions you have with your health care provider.

## 2015-03-07 LAB — GC/CHLAMYDIA PROBE AMP (~~LOC~~) NOT AT ARMC
CHLAMYDIA, DNA PROBE: NEGATIVE
NEISSERIA GONORRHEA: NEGATIVE

## 2015-04-04 ENCOUNTER — Other Ambulatory Visit (HOSPITAL_COMMUNITY): Payer: Self-pay | Admitting: Nurse Practitioner

## 2015-04-04 DIAGNOSIS — Z3A19 19 weeks gestation of pregnancy: Secondary | ICD-10-CM

## 2015-04-04 DIAGNOSIS — Z3682 Encounter for antenatal screening for nuchal translucency: Secondary | ICD-10-CM

## 2015-04-04 DIAGNOSIS — Z3A13 13 weeks gestation of pregnancy: Secondary | ICD-10-CM

## 2015-04-04 DIAGNOSIS — Z3689 Encounter for other specified antenatal screening: Secondary | ICD-10-CM

## 2015-04-04 LAB — OB RESULTS CONSOLE HIV ANTIBODY (ROUTINE TESTING): HIV: NONREACTIVE

## 2015-04-04 LAB — OB RESULTS CONSOLE RPR: RPR: NONREACTIVE

## 2015-04-04 LAB — OB RESULTS CONSOLE ABO/RH: RH Type: POSITIVE

## 2015-04-04 LAB — OB RESULTS CONSOLE GC/CHLAMYDIA
CHLAMYDIA, DNA PROBE: NEGATIVE
GC PROBE AMP, GENITAL: NEGATIVE

## 2015-04-04 LAB — OB RESULTS CONSOLE HEPATITIS B SURFACE ANTIGEN: Hepatitis B Surface Ag: NEGATIVE

## 2015-04-04 LAB — OB RESULTS CONSOLE ANTIBODY SCREEN: ANTIBODY SCREEN: NEGATIVE

## 2015-04-04 LAB — OB RESULTS CONSOLE RUBELLA ANTIBODY, IGM: Rubella: IMMUNE

## 2015-04-19 ENCOUNTER — Ambulatory Visit (HOSPITAL_COMMUNITY)
Admission: RE | Admit: 2015-04-19 | Discharge: 2015-04-19 | Disposition: A | Discharge status: Home / Self Care | Payer: Medicaid Other | Source: Ambulatory Visit | Attending: Nurse Practitioner | Admitting: Nurse Practitioner

## 2015-04-19 ENCOUNTER — Encounter (HOSPITAL_COMMUNITY): Payer: Self-pay

## 2015-04-19 DIAGNOSIS — Z36 Encounter for antenatal screening of mother: Secondary | ICD-10-CM | POA: Insufficient documentation

## 2015-04-19 DIAGNOSIS — Z3682 Encounter for antenatal screening for nuchal translucency: Secondary | ICD-10-CM

## 2015-04-19 DIAGNOSIS — Z3A13 13 weeks gestation of pregnancy: Secondary | ICD-10-CM

## 2015-04-19 NOTE — ED Notes (Signed)
Interpretor scheduled by language resources used for today's appointment.

## 2015-04-26 ENCOUNTER — Other Ambulatory Visit (HOSPITAL_COMMUNITY): Payer: Self-pay | Admitting: Nurse Practitioner

## 2015-05-27 ENCOUNTER — Other Ambulatory Visit (HOSPITAL_COMMUNITY): Payer: Self-pay | Admitting: Nurse Practitioner

## 2015-05-27 ENCOUNTER — Ambulatory Visit (HOSPITAL_COMMUNITY)
Admission: RE | Admit: 2015-05-27 | Discharge: 2015-05-27 | Disposition: A | Discharge status: Home / Self Care | Payer: Medicaid Other | Source: Ambulatory Visit | Attending: Nurse Practitioner | Admitting: Nurse Practitioner

## 2015-05-27 DIAGNOSIS — Z3A19 19 weeks gestation of pregnancy: Secondary | ICD-10-CM

## 2015-05-27 DIAGNOSIS — Z3A18 18 weeks gestation of pregnancy: Secondary | ICD-10-CM

## 2015-05-27 DIAGNOSIS — Z3689 Encounter for other specified antenatal screening: Secondary | ICD-10-CM

## 2015-05-27 DIAGNOSIS — Z36 Encounter for antenatal screening of mother: Secondary | ICD-10-CM | POA: Diagnosis not present

## 2015-07-24 NOTE — L&D Delivery Note (Signed)
Patient is 30 y.o. G2P1001 7148w2d admitted with PROM. Low-risk pregnancy. Augmentation of labor with pitocin.   Delivery Note At 6:15 AM a viable female was delivered via  (Presentation: Left Occiput Anterior).  APGAR: 8, 9; weight pending. Placenta status: intact.  Cord:3V  with the following complications: None. She pushed with good maternal effort to deliver a healthy baby. Baby delivered without difficulty, was noted to have good tone and place on maternal abdomen for oral suctioning, drying and stimulation. Delayed cord clamping performed.   Anesthesia: None  Episiotomy:  None Lacerations:  None Est. Blood Loss (mL):  100  Mom to postpartum.  Baby to Couplet care / Skin to Skin.   Caryl AdaJazma Phelps, DO 10/03/2015, 6:56 AM PGY-2, [facility]

## 2015-10-02 ENCOUNTER — Encounter (HOSPITAL_COMMUNITY): Payer: Self-pay

## 2015-10-02 ENCOUNTER — Inpatient Hospital Stay (HOSPITAL_COMMUNITY)
Admission: AD | Admit: 2015-10-02 | Discharge: 2015-10-05 | DRG: 775 | Disposition: A | Discharge status: Home / Self Care | Payer: Medicaid Other | Source: Ambulatory Visit | Attending: Obstetrics & Gynecology | Admitting: Obstetrics & Gynecology

## 2015-10-02 DIAGNOSIS — O4292 Full-term premature rupture of membranes, unspecified as to length of time between rupture and onset of labor: Principal | ICD-10-CM | POA: Diagnosis present

## 2015-10-02 DIAGNOSIS — Z3A37 37 weeks gestation of pregnancy: Secondary | ICD-10-CM

## 2015-10-02 DIAGNOSIS — O4290 Premature rupture of membranes, unspecified as to length of time between rupture and onset of labor, unspecified weeks of gestation: Secondary | ICD-10-CM | POA: Diagnosis present

## 2015-10-02 LAB — CBC
HCT: 37.4 % (ref 36.0–46.0)
HEMOGLOBIN: 12.6 g/dL (ref 12.0–15.0)
MCH: 27.8 pg (ref 26.0–34.0)
MCHC: 33.7 g/dL (ref 30.0–36.0)
MCV: 82.4 fL (ref 78.0–100.0)
Platelets: 260 10*3/uL (ref 150–400)
RBC: 4.54 MIL/uL (ref 3.87–5.11)
RDW: 15.2 % (ref 11.5–15.5)
WBC: 8.3 10*3/uL (ref 4.0–10.5)

## 2015-10-02 LAB — AMNISURE RUPTURE OF MEMBRANE (ROM) NOT AT ARMC: Amnisure ROM: POSITIVE

## 2015-10-02 LAB — TYPE AND SCREEN
ABO/RH(D): B POS
ANTIBODY SCREEN: NEGATIVE

## 2015-10-02 MED ORDER — OXYTOCIN BOLUS FROM INFUSION: 500.0000 mL | INTRAVENOUS | Status: DC

## 2015-10-02 MED ORDER — LIDOCAINE HCL (PF) 1 % IJ SOLN
30.0000 mL | INTRAMUSCULAR | Status: DC | PRN
  Filled 2015-10-02: qty 30

## 2015-10-02 MED ORDER — ONDANSETRON HCL 4 MG/2ML IJ SOLN: 4.0000 mg | Freq: Four times a day (QID) | INTRAMUSCULAR | Status: DC | PRN

## 2015-10-02 MED ORDER — OXYTOCIN 10 UNIT/ML IJ SOLN
2.5000 [IU]/h | INTRAVENOUS | Status: DC
  Administered 2015-10-03: 06:00:00 via INTRAVENOUS

## 2015-10-02 MED ORDER — ACETAMINOPHEN 325 MG PO TABS
650.0000 mg | ORAL_TABLET | ORAL | Status: DC | PRN
  Filled 2015-10-02: qty 2

## 2015-10-02 MED ORDER — CITRIC ACID-SODIUM CITRATE 334-500 MG/5ML PO SOLN: 30.0000 mL | ORAL | Status: DC | PRN

## 2015-10-02 MED ORDER — LACTATED RINGERS IV SOLN: 500.0000 mL | INTRAVENOUS | Status: DC | PRN

## 2015-10-02 MED ORDER — LACTATED RINGERS IV SOLN
INTRAVENOUS | Status: DC
  Administered 2015-10-02: 22:00:00 via INTRAVENOUS
  Administered 2015-10-03: 125 mL/h via INTRAVENOUS

## 2015-10-02 NOTE — H&P (Signed)
LABOR ADMISSION HISTORY AND PHYSICAL  Amber Le is a 30 y.o. female G2P1001 with IUP at 1928w1d by LMP presenting for premature rupture of membranes around 4pm today. Patient states she had a big gush of clear odorless fluid. Continues to endorse some leaking. Denies vaginal bleeding but had some brown blood on Thursday. She reports +FM, + contractions(>695min apart). She plans on breast feeding.  Dating: By LMP c/w early US --->  Estimated Date of Delivery: 10/22/15  Sono:   @[redacted]w[redacted]d , CWD, normal anatomy, anterior placenta, 251g, 41% EFW   Prenatal History/Complications: Prenatal care at health department Low-risk pregnancy FOB with advance paternal age 20>50yo   Past Medical History: Past Medical History  Diagnosis Date  . Medical history non-contributory     Past Surgical History: Past Surgical History  Procedure Laterality Date  . No past surgeries      Obstetrical History: OB History    Gravida Para Term Preterm AB TAB SAB Ectopic Multiple Living   2 1 1       1       Social History: Social History   Social History  . Marital Status: Married    Spouse Name: N/A  . Number of Children: N/A  . Years of Education: N/A   Social History Main Topics  . Smoking status: Never Smoker   . Smokeless tobacco: None  . Alcohol Use: No  . Drug Use: No  . Sexual Activity: Yes     Comment: pregnancy   Other Topics Concern  . None   Social History Narrative    Family History: Family History  Problem Relation Age of Onset  . Alcohol abuse Neg Hx     Allergies: No Known Allergies  Prescriptions prior to admission  Medication Sig Dispense Refill Last Dose  . Prenatal Vit-Fe Fumarate-FA (PRENATAL MULTIVITAMIN) TABS Take 1 tablet by mouth at bedtime.    10/01/2015 at Unknown time     Review of Systems  All systems reviewed and negative except as stated in HPI  BP 113/70 mmHg  Pulse 92  Temp(Src) 98 F (36.7 C) (Oral)  Resp 18  Ht 4\' 11"  (1.499 m)  Wt 154 lb 6.4 oz  (70.035 kg)  BMI 31.17 kg/m2  LMP 01/15/2015 General appearance: alert, cooperative and no distress Lungs: clear to auscultation bilaterally Heart: regular rate and rhythm Abdomen: soft, non-tender; bowel sounds normal Pelvic: adequate Extremities: Homans sign is negative, no sign of DVT, edema Presentation: cephalic Fetal monitoring: Baseline: 145 bpm, Variability: Good {> 6 bpm), Accelerations: Reactive and Decelerations: Absent Uterine activity: Frequency: Every 15 minutes, irregular Dilation: 1 Effacement (%): 60 Station: -3 Exam by:: Dr. Doroteo GlassmanPhelps   Prenatal labs: ABO, Rh:  B Pos Antibody:  Neg Rubella:  Immune RPR:   Neg HBsAg:   Neg HIV:   Non-reactive GBS:   Unknown 1 hr Glucola 144; 3hr GTT 73/108/117/106 Genetic screening  declined Anatomy US normal  Prenatal Transfer Tool  Maternal Diabetes: No Genetic Screening: Declined Maternal Ultrasounds/Referrals: Normal Fetal Ultrasounds or other Referrals:  None Maternal Substance Abuse:  No Significant Maternal Medications:  None Significant Maternal Lab Results: Lab values include: Other: GBS Unknown  Results for orders placed or performed during the hospital encounter of 10/02/15 (from the past 24 hour(s))  Amnisure rupture of membrane (rom)not at [facility]   Collection Time: 10/02/15  8:41 PM  Result Value Ref Range   Amnisure ROM POSITIVE     Patient Active Problem List   Diagnosis Date Noted  .  Premature rupture of membranes 10/02/2015    Assessment: Amber Le is a 30 y.o. G2P1001 at [redacted]w[redacted]d here with PROM at 4pm without onset of labor. GBS status unknown.   Admit to [employer] #Labor: Will wait 3-4 hours to see if her body will go into labor if not will need to augment with Pitocin.  #Pain: Does not want pain medication #FWB: Category 1 #ID: GBS Unknown; rapid GBS PCR obtained #MOF: breast #MOC: Alla German, DO 10/02/2015, 9:17 PM PGY-2, [facility]

## 2015-10-02 NOTE — MAU Note (Signed)
Pt thinks that her water broke around 4pm. Having some contractions. +FM

## 2015-10-02 NOTE — MAU Note (Signed)
Pt believes her water broke around 4 pm today.  Pt reports +FM, denies bleeding.  Pt states fluid is clear and continues to leak.

## 2015-10-03 ENCOUNTER — Encounter (HOSPITAL_COMMUNITY): Payer: Self-pay

## 2015-10-03 DIAGNOSIS — Z3A37 37 weeks gestation of pregnancy: Secondary | ICD-10-CM

## 2015-10-03 DIAGNOSIS — O4292 Full-term premature rupture of membranes, unspecified as to length of time between rupture and onset of labor: Secondary | ICD-10-CM

## 2015-10-03 LAB — GROUP B STREP BY PCR: GROUP B STREP BY PCR: POSITIVE — AB

## 2015-10-03 LAB — RPR: RPR Ser Ql: NONREACTIVE

## 2015-10-03 MED ORDER — FENTANYL CITRATE (PF) 100 MCG/2ML IJ SOLN
INTRAMUSCULAR | Status: AC
  Administered 2015-10-03: 100 ug via INTRAVENOUS
  Filled 2015-10-03: qty 2

## 2015-10-03 MED ORDER — PENICILLIN G POTASSIUM 5000000 UNITS IJ SOLR
5.0000 10*6.[IU] | Freq: Once | INTRAVENOUS | Status: AC
  Administered 2015-10-03: 5 10*6.[IU] via INTRAVENOUS
  Filled 2015-10-03: qty 5

## 2015-10-03 MED ORDER — LACTATED RINGERS IV SOLN
1.0000 m[IU]/min | INTRAVENOUS | Status: DC
  Administered 2015-10-03: 2 m[IU]/min via INTRAVENOUS
  Filled 2015-10-03: qty 10

## 2015-10-03 MED ORDER — TETANUS-DIPHTH-ACELL PERTUSSIS 5-2.5-18.5 LF-MCG/0.5 IM SUSP: 0.5000 mL | Freq: Once | INTRAMUSCULAR | Status: DC

## 2015-10-03 MED ORDER — WITCH HAZEL-GLYCERIN EX PADS: 1.0000 "application " | MEDICATED_PAD | CUTANEOUS | Status: DC | PRN

## 2015-10-03 MED ORDER — ZOLPIDEM TARTRATE 5 MG PO TABS: 5.0000 mg | ORAL_TABLET | Freq: Every evening | ORAL | Status: DC | PRN

## 2015-10-03 MED ORDER — LANOLIN HYDROUS EX OINT: TOPICAL_OINTMENT | CUTANEOUS | Status: DC | PRN

## 2015-10-03 MED ORDER — SIMETHICONE 80 MG PO CHEW: 80.0000 mg | CHEWABLE_TABLET | ORAL | Status: DC | PRN

## 2015-10-03 MED ORDER — DIBUCAINE 1 % RE OINT: 1.0000 "application " | TOPICAL_OINTMENT | RECTAL | Status: DC | PRN

## 2015-10-03 MED ORDER — NALOXONE HCL 0.4 MG/ML IJ SOLN
INTRAMUSCULAR | Status: AC
  Filled 2015-10-03: qty 1

## 2015-10-03 MED ORDER — DIPHENHYDRAMINE HCL 25 MG PO CAPS: 25.0000 mg | ORAL_CAPSULE | Freq: Four times a day (QID) | ORAL | Status: DC | PRN

## 2015-10-03 MED ORDER — ONDANSETRON HCL 4 MG/2ML IJ SOLN: 4.0000 mg | INTRAMUSCULAR | Status: DC | PRN

## 2015-10-03 MED ORDER — TERBUTALINE SULFATE 1 MG/ML IJ SOLN
0.2500 mg | Freq: Once | INTRAMUSCULAR | Status: DC | PRN
  Filled 2015-10-03: qty 1

## 2015-10-03 MED ORDER — SENNOSIDES-DOCUSATE SODIUM 8.6-50 MG PO TABS
2.0000 | ORAL_TABLET | ORAL | Status: DC
  Administered 2015-10-04 – 2015-10-05 (×2): 2 via ORAL
  Filled 2015-10-03 (×2): qty 2

## 2015-10-03 MED ORDER — ONDANSETRON HCL 4 MG PO TABS: 4.0000 mg | ORAL_TABLET | ORAL | Status: DC | PRN

## 2015-10-03 MED ORDER — IBUPROFEN 600 MG PO TABS
600.0000 mg | ORAL_TABLET | Freq: Four times a day (QID) | ORAL | Status: DC
  Administered 2015-10-03 – 2015-10-05 (×10): 600 mg via ORAL
  Filled 2015-10-03 (×10): qty 1

## 2015-10-03 MED ORDER — ACETAMINOPHEN 325 MG PO TABS
650.0000 mg | ORAL_TABLET | ORAL | Status: DC | PRN
  Administered 2015-10-03: 650 mg via ORAL

## 2015-10-03 MED ORDER — BENZOCAINE-MENTHOL 20-0.5 % EX AERO: 1.0000 "application " | INHALATION_SPRAY | CUTANEOUS | Status: DC | PRN

## 2015-10-03 MED ORDER — FENTANYL CITRATE (PF) 100 MCG/2ML IJ SOLN
100.0000 ug | INTRAMUSCULAR | Status: DC | PRN
  Administered 2015-10-03 (×3): 100 ug via INTRAVENOUS
  Filled 2015-10-03 (×2): qty 2

## 2015-10-03 MED ORDER — PRENATAL MULTIVITAMIN CH
1.0000 | ORAL_TABLET | Freq: Every day | ORAL | Status: DC
  Administered 2015-10-03 – 2015-10-05 (×3): 1 via ORAL
  Filled 2015-10-03 (×3): qty 1

## 2015-10-03 MED ORDER — DEXTROSE 5 % IV SOLN
2.5000 10*6.[IU] | INTRAVENOUS | Status: DC
  Administered 2015-10-03: 2.5 10*6.[IU] via INTRAVENOUS
  Filled 2015-10-03 (×3): qty 2.5

## 2015-10-03 NOTE — Lactation Note (Signed)
This note was copied from a baby's chart. Lactation Consultation Note  Patient Name: Amber Le YNWGN'FToday's Date: 10/03/2015 Reason for consult: Initial assessment Mom was eating and baby was sleeping. Mom asked LC to come back later and she would like an interpreter that speaks [place].   Maternal Data    Feeding Feeding Type: Breast Milk Length of feed: 5 min  LATCH Score/Interventions                      Lactation Tools Discussed/Used     Consult Status Consult Status: Follow-up Date: 10/03/15 Follow-up type: In-patient    Rulon Eisenmengerlizabeth E Prudy Candy 10/03/2015, 6:26 PM

## 2015-10-03 NOTE — Plan of Care (Signed)
Problem: Life Cycle: Goal: Risk for postpartum hemorrhage will decrease Outcome: Completed/Met Date Met:  10/03/15 intrepreter [id number]

## 2015-10-03 NOTE — Plan of Care (Signed)
Problem: Life Cycle: Goal: Chance of risk for complications during the postpartum period will decrease Outcome: Completed/Met Date Met:  10/03/15 Use pacifica [phone]

## 2015-10-03 NOTE — Progress Notes (Signed)
RN used video remote interpreter to talk to pt about plan of care and medications, husband at bedside; both verbalized understanding

## 2015-10-04 LAB — CBC
HEMATOCRIT: 34.2 % — AB (ref 36.0–46.0)
HEMOGLOBIN: 11.1 g/dL — AB (ref 12.0–15.0)
MCH: 26.8 pg (ref 26.0–34.0)
MCHC: 32.5 g/dL (ref 30.0–36.0)
MCV: 82.6 fL (ref 78.0–100.0)
Platelets: 207 10*3/uL (ref 150–400)
RBC: 4.14 MIL/uL (ref 3.87–5.11)
RDW: 15.3 % (ref 11.5–15.5)
WBC: 11.4 10*3/uL — ABNORMAL HIGH (ref 4.0–10.5)

## 2015-10-04 NOTE — Progress Notes (Signed)
Post Partum Day 1  [place] phone translator was used to obtain information Subjective:    no complaints, up ad lib, voiding, tolerating PO and + flatus  Objective: Blood pressure 91/53, pulse 74, temperature 98.3 F (36.8 C), temperature source Oral, resp. rate 18, height 4\' 11"  (1.499 m), weight 70.035 kg (154 lb 6.4 oz), last menstrual period 01/15/2015, unknown if currently breastfeeding.  Physical Exam:  General: alert, cooperative, appears stated age and no distress Lochia: appropriate Uterine Fundus: firm Incision: N/A DVT Evaluation: No evidence of DVT seen on physical exam. Negative Homan's sign. No cords or calf tenderness. No significant calf/ankle edema.   Recent Labs  10/02/15 2157 10/04/15 0513  HGB 12.6 11.1*  HCT 37.4 34.2*    Assessment/Plan: Discharge home. Pt states she feels well and would like to discharge today. Discharge home pending pediatrician evaluation.    LOS: 2 days   [facility] Holland-Payne 10/04/2015, 7:46 AM

## 2015-10-04 NOTE — Lactation Note (Signed)
This note was copied from a baby's chart. Lactation Consultation Note  Patient Name: Amber Le ZOXWR'UToday's Date: 10/04/2015 Reason for consult: Follow-up assessment;Other (Comment) (early term baby 8337.2) [place]namese interpreter [phone] Truyen used for visit. Mom did not BF her 1st baby who was term. This baby has been to breast 8 times in past 24 hours for 5-35 minutes. [facility] for short periods most of the time. Assisted Mom at this visit with positioning and obtaining more depth with latch. Demonstrated breast compression to help with latch. Discussed early term newborn behaviors with parents and advised Mom these baby's sometimes need a lot of stimulation to stay engaged at the breast. Baby does demonstrate some good suckling bursts with stimulation. Advised baby needs to be at the breast with feeding ques, but at least every 3 hours. Encouraged to keep baby nursing for 15-20 minutes both breasts when possible. Discussed with Mom post pumping to encourage milk production due to early term status, having EBM to supplement if needed. Mom agreeable. Mom was hoping to be d/c today. If she does not go home, RN will set up DEBP for Mom to use as discussed. Engorgement care reviewed if needed, refer to Baby N Me booklet page 24. Encouraged to call for assist as needed/questions/concerns.   Maternal Data    Feeding Feeding Type: Breast Fed Length of feed: 10 min  LATCH Score/Interventions Latch: Repeated attempts needed to sustain latch, nipple held in mouth throughout feeding, stimulation needed to elicit sucking reflex. Intervention(s): Adjust position;Assist with latch;Breast massage;Breast compression  Audible Swallowing: A few with stimulation Intervention(s): Skin to skin  Type of Nipple: Everted at rest and after stimulation  Comfort (Breast/Nipple): Soft / non-tender     Hold (Positioning): Assistance needed to correctly position infant at breast and maintain  latch. Intervention(s): Breastfeeding basics reviewed;Support Pillows;Position options;Skin to skin  LATCH Score: 7  Lactation Tools Discussed/Used Tools: Pump Breast pump type: Manual   Consult Status Consult Status: Follow-up Date: 10/05/15 Follow-up type: In-patient    Alfred LevinsGranger, Donne Robillard Ann 10/04/2015, 11:45 AM

## 2015-10-04 NOTE — Discharge Summary (Signed)
OB Discharge Summary     Patient Name: Amber Le DOB: 1985-10-16 MRN: [id number]  Date of admission: 10/02/2015 Delivering MD: Pincus Large   Date of discharge: 10/04/2015  Admitting diagnosis: 36.5 WKS, WATER BROKE Intrauterine pregnancy: [redacted]w[redacted]d     Secondary diagnosis:  Active Problems:   Premature rupture of membranes   Premature rupture of membranes in pregnancy   NSVD (normal spontaneous vaginal delivery)  Additional problems: None     Discharge diagnosis: Term Pregnancy Delivered                                                                                                Post partum procedures:none  Augmentation: Pitocin  Complications: None  Hospital course:  Onset of Labor With Vaginal Delivery     30 y.o. yo [id number] at [redacted]w[redacted]d was admitted in Latent Labor on 10/02/2015. Patient had an uncomplicated labor course as follows:  Membrane Rupture Time/Date: 4:00 PM ,10/02/2015   Intrapartum Procedures: Episiotomy: None [1]                                         Lacerations:  None [1]  Patient had a delivery of a Viable infant. 10/03/2015  Information for the patient's newborn:  Scotti, Motter [[id number]]  Delivery Method: Vag-Spont    [name] had an uncomplicated postpartum course.  She is ambulating, tolerating a regular diet, passing flatus, and urinating well. Patient is discharged home in stable condition on 10/04/2015.    Physical exam  Filed Vitals:   10/03/15 1350 10/03/15 1805 10/03/15 2008 10/04/15 0606  BP: 96/58 110/57 105/66 91/53  Pulse: 78 75 73 74  Temp: 99.2 F (37.3 C) 98.1 F (36.7 C) 97.3 F (36.3 C) 98.3 F (36.8 C)  TempSrc: Oral Oral Oral Oral  Resp: Height:      Weight:       General: alert, cooperative and no distress Lochia: appropriate Uterine Fundus: firm Incision: N/A DVT Evaluation: No evidence of DVT seen on physical exam. Negative Homan's sign. No cords or calf tenderness. Labs: Lab Results  Component  Value Date   WBC 11.4* 10/04/2015   HGB 11.1* 10/04/2015   HCT 34.2* 10/04/2015   MCV 82.6 10/04/2015   PLT 207 10/04/2015   No flowsheet data found.  Discharge instruction: per After Visit Summary and "Baby and Me Booklet".  After visit meds:    Medication List    ASK your doctor about these medications        prenatal multivitamin Tabs tablet  Take 1 tablet by mouth at bedtime.        Diet: routine diet  Activity: Advance as tolerated. Pelvic rest for 6 weeks.   Outpatient follow up:6 weeks Follow up Appt:No future appointments. Follow up Visit:No Follow-up on file.  Postpartum contraception: Condoms  Newborn Data: Live born female  Birth Weight: 6 lb 4 oz (2835 g) APGAR: 8, 9  Baby Feeding: Breast Disposition:home with mother  10/04/2015 LEFTWICH-KIRBY, Misty StanleyLISA, CNM

## 2015-10-04 NOTE — Progress Notes (Signed)
[employer] used for live [place] interpreter to answer questions and update on plan of care.

## 2015-10-04 NOTE — Discharge Instructions (Signed)
Postpartum Care After Vaginal Delivery °After you deliver your newborn (postpartum period), the usual stay in the hospital is 24-72 hours. If there were problems with your labor or delivery, or if you have other medical problems, you might be in the hospital longer.  °While you are in the hospital, you will receive help and instructions on how to care for yourself and your newborn during the postpartum period.  °While you are in the hospital: °· Be sure to tell your nurses if you have pain or discomfort, as well as where you feel the pain and what makes the pain worse. °· If you had an incision made near your vagina (episiotomy) or if you had some tearing during delivery, the nurses may put ice packs on your episiotomy or tear. The ice packs may help to reduce the pain and swelling. °· If you are breastfeeding, you may feel uncomfortable contractions of your uterus for a couple of weeks. This is normal. The contractions help your uterus get back to normal size. °· It is normal to have some bleeding after delivery. °¨ For the first 1-3 days after delivery, the flow is red and the amount may be similar to a period. °¨ It is common for the flow to start and stop. °¨ In the first few days, you may pass some small clots. Let your nurses know if you begin to pass large clots or your flow increases. °¨ Do not  flush blood clots down the toilet before having the nurse look at them. °¨ During the next 3-10 days after delivery, your flow should become more watery and pink or brown-tinged in color. °¨ Ten to fourteen days after delivery, your flow should be a small amount of yellowish-white discharge. °¨ The amount of your flow will decrease over the first few weeks after delivery. Your flow may stop in 6-8 weeks. Most women have had their flow stop by 12 weeks after delivery. °· You should change your sanitary pads frequently. °· Wash your hands thoroughly with soap and water for at least 20 seconds after changing pads, using  the toilet, or before holding or feeding your newborn. °· You should feel like you need to empty your bladder within the first 6-8 hours after delivery. °· In case you become weak, lightheaded, or faint, call your nurse before you get out of bed for the first time and before you take a shower for the first time. °· Within the first few days after delivery, your breasts may begin to feel tender and full. This is called engorgement. Breast tenderness usually goes away within 48-72 hours after engorgement occurs. You may also notice milk leaking from your breasts. If you are not breastfeeding, do not stimulate your breasts. Breast stimulation can make your breasts produce more milk. °· Spending as much time as possible with your newborn is very important. During this time, you and your newborn can feel close and get to know each other. Having your newborn stay in your room (rooming in) will help to strengthen the bond with your newborn.  It will give you time to get to know your newborn and become comfortable caring for your newborn. °· Your hormones change after delivery. Sometimes the hormone changes can temporarily cause you to feel sad or tearful. These feelings should not last more than a few days. If these feelings last longer than that, you should talk to your caregiver. °· If desired, talk to your caregiver about methods of family planning or contraception. °·   Talk to your caregiver about immunizations. Your caregiver may want you to have the following immunizations before leaving the hospital:  Tetanus, diphtheria, and pertussis (Tdap) or tetanus and diphtheria (Td) immunization. It is very important that you and your family (including grandparents) or others caring for your newborn are up-to-date with the Tdap or Td immunizations. The Tdap or Td immunization can help protect your newborn from getting ill.  Rubella immunization.  Varicella (chickenpox) immunization.  Influenza immunization. You should  receive this annual immunization if you did not receive the immunization during your pregnancy.   This information is not intended to replace advice given to you by your health care provider. Make sure you discuss any questions you have with your health care provider.   Document Released: 05/06/2007 Document Revised: 04/02/2012 Document Reviewed: 03/05/2012 Elsevier Interactive Patient Education 2016 Elsevier Inc. Ch?m [place] sau ?? sau khi sinh ng m ??o Sau khi sinh em b (giai ?o?n sau sinh), th?i gian l?u l?i bnh th??ng trong b?nh vi?n l 24-72 gi?. N?u c v?n ?? v?i chuy?n d? ho?c chuy?n d? c?a b?n, ho?c n?u b?n c cc v?n ?? v? y t? khc, b?n c th? ? trong b?nh vi?n lu h?n. Trong khi b?n ? trong b?nh vi?n, b?n s? nh?n ???c s? tr? gip v h??ng d?n v? cch ch?m  b?n thn v tr? s? sinh trong giai ?o?n sau sinh. Trong khi b?n ?ang ? trong b?nh vi?n: Hy ch?c ch?n ni v?i y t c?a b?n n?u b?n b? ?au ho?c kh ch?u, c?ng nh? n?i b?n c?m th?y ?au v nh?ng g lm cho c?n ?au t?i t? h?n. N?u b?n b? r?ch g?n m ??o c?a b?n (ph?u thu?t c?t t?ng sinh d?c) ho?c n?u b?n b? rch trong khi sinh, bc s? c th? ??t ti n??c ? vo ph?u thu?t c?t t?ng sinh d?c ho?c rch. Gi ? c th? gip lm gi?m ?au v s?ng t?y. N?u b?n ?ang cho con b s?a m?, b?n c th? c?m th?y nh?ng c?n co th?t kh ch?u trong t? cung trong hai tu?n. ?i?u ny l bnh th??ng. Cc c?n co th?t gip t? cung c?a b?n tr? l?i kch th??c bnh th??ng. Thng th??ng, c m?t s? ch?y mu sau khi sinh. Trong 1-3 ngy ??u sau khi giao hng, l?u l??ng c mu ?? v c th? t??ng ???ng v?i m?t kho?ng th?i gian. Th??ng th dng ch?y b?t ??u v d?ng l?i. Trong vi ngy ??u tin, b?n c th? v??t qua m?t s? c?c mu ?ng nh?Carmon Sails. Hy ?? y t c?a b?n bi?t n?u b?n b?t ??u v??t qua cc c?c mu ?ng ho?c dng ch?y c?a b?n t?ng ln. Khng ch?y mu mu xu?ng nh v? sinh tr??c khi ??a y t nhn h?. Trong 3-10 ngy ti?p theo sau khi giao hng, dng n??c c?a b?n s? tr?  nn nhi?u n??c v c mu h?ng ho?c nu nh?t. M??i ??n m??i b?n ngy sau khi sinh, dng n??c c?a b?n ph?i l m?t l??ng nh? ch?t xm vng tr?ng. S? l??ng l?u l??ng c?a b?n s? gi?m trong vi tu?n ??u sau khi giao hng. Dng ch?y c?a b?n c th? d?ng l?i trong 6-8 tu?n. H?u h?t ph? n? ? d?ng l?i sau 12 tu?n l? sau khi sinh. B?n nn th??ng xuyn thay b?ng v? sinh. R?a tay th?t k? b?ng x bng v n??c t nh?t 20 giy sau khi thay mi?ng v?i, s? d?ng nh v? sinh, ho?c tr??c khi gi? ho?c cho  tr? ?n. B?n s? c?m th?y nh? b?n c?n ?? tr?ng bng quang c?a b?n trong vng 6-8 gi? ??u tin sau khi sinh. Trong tr??ng h?p b?n tr? nn y?u ?u?i, lng lng, ho?c y?u ?t, hy g?i cho y t c?a b?n tr??c khi b?n ra kh?i gi??ng l?n ??u tin v tr??c khi b?n t?m l?n ??u tin. Trong vi ngy ??u sau khi sinh, v b?n c th? b?t ??u c?m th?y d?u dng v ??y ??. ?y ???c g?i l s? ngh?ch ng?m. ?au ng?c th??ng bi?n m?t trong vng 48-72 gi? sau khi xu?t huy?t. B?n c?ng c th? nh?n th?y s?a b? r r? t? v c?a b?n. N?u b?n khng cho con b s?a m?, ??ng kch thch v c?a b?n. Kch thch ng?c c th? lm cho v c?a b?n s?n xu?t nhi?u s?a h?n. Dnh nhi?u th?i gian nh?t c th? v?i tr? s? sinh c?a b?n l r?t quan tr?ng. Trong th?i gian ny, b?n v tr? s? sinh c?a b?n c th? c?m th?y g?n g?i v lm quen v?i nhau. C ???c tr? s? sinh c?a b?n ? trong phng c?a b?n (? trong) s? gip t?ng c??ng m?i lin h? v?i tr? s? sinh c?a b?n. N s? cho b?n th?i gian ?? tm hi?u v? tr? s? sinh c?a b?n v c?m th?y tho?i mi ch?m Si[place] cho tr? s? sinh c?a b?n. Hc mn c?a b?n thay ??i sau khi sinh. ?i khi s? thay ??i hc mn c th? t?m th?i lm b?n c?m th?y bu?n ho?c khc. Nh?ng c?m gic ny khng nn ko di h?n m?t vi ngy. N?u nh?ng c?m xc ny ko di h?n th?, b?n nn ni chuy?n v?i ng??i ch?m [place] c?a b?n. N?u mu?n, ni chuy?n v?i ng??i ch?m [place] c?a b?n v? cc ph??ng php k? ho?ch ha gia ?nh ho?c ng?a [place]. Ni chuy?n v?i ng??i ch?m [place] v? ch?ng ng?a. Ng??i  ch?m [place] c th? mu?n b?n ch?ng ng?a sau ?y tr??c khi r?i b?nh vi?n: U?n vn, b?ch h?u, ho g (Tdap) ho?c u?n vn v b?ch h?u (Td). ?i?u quan tr?ng l b?n v gia ?nh (bao g?m c? ng b) ho?c nh?ng ng??i khc ch?m [place] tr? s? sinh c?a b?n ???c c?p nh?t v?i cc lo?i tim ch?ng Tdap ho?c Td. Tim ch?ng Tdap ho?c Td c th? gip b?o v? tr? s? sinh kh?i b? ?m. Tim phng s?i. Chch ng?a b?nh th?y ??u (Varicella). Ch?ng ng?a cm. B?n nn ch?ng ng?a hng n?m n?u b?n khng ???c ch?ng ng?a trong th?i k? mang [place].  Thng tin ny khng nh?m thay th? cc l?i khuyn do nh cung c?p d?ch v? ch?m [place] s?c kho? cung c?p. ??m b?o b?n th?o lu?n b?t k? cu h?i no b?n c v?i nh cung c?p d?ch v? ch?m  s?c kho?.  Ti li?u ? cng b?: 14/04/2007 Ti li?u ? s?a ??i: 04/02/2012 Ti li?u ? ph duy?t: 03/05/2012 Gio d?c B?nh nhn t??ng tc Elsevier  2016 [employer]

## 2015-10-04 NOTE — Lactation Note (Signed)
This note was copied from a baby's chart. Lactation Consultation Note  Patient Name: Girl Dario AveHbiek Nies ZOXWR'UToday's Date: 10/04/2015 Reason for consult: Follow-up assessment;Other (Comment) (early term 37.2 weeks) [place]namese interpreter [phone] Loan used for visit. Assisted Mom with positioning and obtaining depth with latch. Set up DEBP for Mom to start post pumping 2-3 times tonight for 15 minutes each time. Baby starting to cluster feed. Advised Mom to BF whenever she is giving feeding ques, at least 8 or more times in 24 hours. Call for assist as needed.   Maternal Data    Feeding Feeding Type: Breast Fed Length of feed: 25 min  LATCH Score/Interventions Latch: Grasps breast easily, tongue down, lips flanged, rhythmical sucking. Intervention(s): Adjust position;Assist with latch;Breast massage;Breast compression  Audible Swallowing: A few with stimulation  Type of Nipple: Everted at rest and after stimulation  Comfort (Breast/Nipple): Soft / non-tender     Hold (Positioning): Assistance needed to correctly position infant at breast and maintain latch. Intervention(s): Breastfeeding basics reviewed;Support Pillows;Position options;Skin to skin  LATCH Score: 8  Lactation Tools Discussed/Used Tools: Pump Breast pump type: Double-Electric Breast Pump Pump Review: Setup, frequency, and cleaning;Milk Storage Initiated by:: KG Date initiated:: 10/04/15   Consult Status Consult Status: Follow-up Date: 10/05/15 Follow-up type: In-patient    Alfred LevinsGranger, Oria Klimas Ann 10/04/2015, 4:12 PM

## 2015-10-04 NOTE — Lactation Note (Signed)
This note was copied from a baby's chart. Lactation Consultation Note FOB at bedside interpreter for new mom. States BF going well. Has ocassional pain when BF. Mom has erect nipples, compressible. Hand expression taught w/colostrum noted. Baby BF 10 min. Each BF. Encouraged to try to BF longer. Reviewed deep latch and I&O. Mom encouraged to feed baby 8-12 times/24 hours and with feeding cues. Reviewed Baby & Me book's Breastfeeding Basics. Encouraged STS,  And I&O. WH/LC brochure given w/resources, support groups and LC services. Answered question through FOB that mom had. WH/LC brochure given w/resources, support groups and LC services.  Patient Name: Amber Le  GNFAO'ZToday's Date: 10/04/2015 Reason for consult: Initial assessment   Maternal Data Has patient been taught Hand Expression?: Yes Does the patient have breastfeeding experience prior to this delivery?: No  Feeding Feeding Type: Breast Fed Length of feed: 10 min  LATCH Score/Interventions Latch: Repeated attempts needed to sustain latch, nipple held in mouth throughout feeding, stimulation needed to elicit sucking reflex.  Audible Swallowing: A few with stimulation  Type of Nipple: Everted at rest and after stimulation  Comfort (Breast/Nipple): Soft / non-tender     Hold (Positioning): No assistance needed to correctly position infant at breast. Intervention(s): Skin to skin;Position options;Support Pillows;Breastfeeding basics reviewed  LATCH Score: 7  Lactation Tools Discussed/Used Tools: Pump   Consult Status Consult Status: Follow-up Date: 10/04/15 Follow-up type: In-patient    Caralee Morea, Diamond NickelLAURA G 10/04/2015, 7:02 AM

## 2015-10-05 NOTE — Discharge Summary (Signed)
OB Discharge Summary  [facility] interpreter used for visit this morning    Patient Name: Amber Le DOB: 05-18-1986 MRN: [id number]  Date of admission: 10/02/2015 Delivering MD: Pincus Large   Date of discharge: 10/05/2015  Admitting diagnosis: 36.5 WKS, WATER BROKE Intrauterine pregnancy: [redacted]w[redacted]d     Secondary diagnosis:  Active Problems:   Premature rupture of membranes   Premature rupture of membranes in pregnancy   NSVD (normal spontaneous vaginal delivery)  Additional problems:  Patient Active Problem List   Diagnosis Date Noted  . NSVD (normal spontaneous vaginal delivery) 10/04/2015  . Premature rupture of membranes 10/02/2015  . Premature rupture of membranes in pregnancy 10/02/2015       Discharge diagnosis: Preterm Pregnancy Delivered                                                                                                Post partum procedures:none  Augmentation: Pitocin  Complications: None  Hospital course:  Onset of Labor With Vaginal Delivery     30 y.o. yo [id number] at [redacted]w[redacted]d was admitted in Latent Labor on 10/02/2015. Patient had an uncomplicated labor course as follows:  Membrane Rupture Time/Date: 4:00 PM ,10/02/2015   Intrapartum Procedures: Episiotomy: None [1]                                         Lacerations:  None [1]  Patient had a delivery of a Viable infant. 10/03/2015  Information for the patient's newborn:  Amber, Le [[id number]]  Delivery Method: Vag-Spont    [name] had an uncomplicated postpartum course.  She is ambulating, tolerating a regular diet, passing flatus, and urinating well. Patient is discharged home in stable condition on 10/05/2015.    Physical exam  Filed Vitals:   10/03/15 2008 10/04/15 0606 10/04/15 1846 10/05/15 0547  BP: 105/66 91/53 101/59 100/69  Pulse: 73 74 85 79  Temp: 97.3 F (36.3 C) 98.3 F (36.8 C) 97.8 F (36.6 C) 98.4 F (36.9 C)  TempSrc: Oral Oral Oral Axillary  Resp: Height:      Weight:      SpO2:    99%   General: alert, cooperative and no distress Lochia: appropriate Uterine Fundus: firm Incision: N/A DVT Evaluation: Negative Homan's sign. No cords or calf tenderness. No significant calf/ankle edema. Labs: Lab Results  Component Value Date   WBC 11.4* 10/04/2015   HGB 11.1* 10/04/2015   HCT 34.2* 10/04/2015   MCV 82.6 10/04/2015   PLT 207 10/04/2015   No flowsheet data found.  Discharge instruction: per After Visit Summary and "Baby and Me Booklet".  After visit meds:    Medication List    ASK your doctor about these medications        prenatal multivitamin Tabs tablet  Take 1 tablet by mouth at bedtime.        Diet: routine diet  Activity: Advance as tolerated. Pelvic rest for 6 weeks.   Outpatient  follow up:6 weeks Follow up Appt:No future appointments. Follow up Visit:No Follow-up on file.  Postpartum contraception: Undecided  Newborn Data: Live born female  Birth Weight: 6 lb 4 oz (2835 g) APGAR: 8, 9  Baby Feeding: Breast Disposition:home with mother   10/05/2015 Beaulah Dinninghristina M Gambino, MD   APP attestation:  I have seen and examined this patient; I agree with above documentation in the Resident's note.   [name] is a 30 y.o. [id number]  PE: BP 100/69 mmHg  Pulse 79  Temp(Src) 98.4 F (36.9 C) (Axillary)  Resp 18  Ht 4\' 11"  (1.499 m)  Wt 154 lb 6.4 oz (70.035 kg)  BMI 31.17 kg/m2  SpO2 99%  LMP 01/15/2015  Breastfeeding? Unknown Gen: calm comfortable, NAD Resp: normal effort, no distress Abd: gravid  ROS, labs, PMH reviewed  Plan: Discharge to home  [facility] CenterClemmons,[name] 10/06/2015, 1:20 PM

## 2015-10-05 NOTE — Lactation Note (Signed)
This note was copied from a Amber Le's chart. Lactation Consultation Note  Patient Name: Amber Le Low ZOXWR'UToday's Date: 10/05/2015 Reason for consult: Follow-up assessment;Infant < 6lbs (early term at 37.2 wks. ) [place]namese interpreter [phone] Lafonda MossesDiana used for visit. Assisted Mom to sustain good depth with latch. Mom latches Amber Le independently but during the feeding Amber Le will become shallow if Mom does not keep Amber Le close. Amber Le demonstrates good suckling bursts with some swallows noted. Demonstrated how to use hand pump to post pump, received approx 3 ml of colostrum which LC demonstrated to Mom how to give back to Amber Le using curved tipped syringe. Plan discussed with Mom is to BF with feeding ques but at least every 3 hours. Keep Amber Le nursing for 15-20 minutes both breasts each feeding when possible. Encouraged to post pump after feedings and give Amber Le back any amount of EBM she receives. Mom plans to use bottle if supplementing. When her milk comes in if Amber Le is nursing well, adequate void/stools, breast softening, then advised she does not need to post pump after feedings, but for now Amber Le may benefit from some extra calories since less than 6 lbs, 37.2 weeks gest and 8% weight loss. Amber Le is noted to have midline frenulum, but this appears to be stretching well, good mobility with tongue. Engorgement care reviewed if needed, advised to refer to Amber Le N Me booklet page 24 and then page 25 of Amber Le N Me booklet for breast milk storage guidelines. Encouraged to call for questions/concerns.   Maternal Data    Feeding Feeding Type: Breast Fed Length of feed: 20 min  LATCH Score/Interventions Latch: Grasps breast easily, tongue down, lips flanged, rhythmical sucking. Intervention(s): Breast massage  Audible Swallowing: Spontaneous and intermittent  Type of Nipple: Everted at rest and after stimulation  Comfort (Breast/Nipple): Filling, red/small blisters or bruises, mild/mod discomfort  Problem noted:  Mild/Moderate discomfort Interventions (Mild/moderate discomfort): Hand massage;Hand expression  Hold (Positioning): Assistance needed to correctly position infant at breast and maintain latch. Intervention(s): Breastfeeding basics reviewed;Support Pillows;Position options;Skin to skin  LATCH Score: 8  Lactation Tools Discussed/Used Tools: Pump Breast pump type: Double-Electric Breast Pump   Consult Status Consult Status: Complete Date: 10/05/15 Follow-up type: In-patient    Alfred LevinsGranger, [name] 10/05/2015, 10:41 AM

## 2015-10-05 NOTE — Lactation Note (Signed)
This note was copied from a baby's chart. Lactation Consultation Note  Patient Name: Girl Dario AveHbiek Colledge VWUJW'JToday's Date: 10/05/2015 Reason for consult: Follow-up assessment;Infant < 6lbs;Other (Comment) (early term 37.2 weeks gest.) [place]Vietnamese interpreter [phone] Truyen used for visit. Mom reports baby just BF. Reports baby BF well, breasts are starting to feel like milk transitioning. Mom has not used DEBP or supplemented. Baby at 8% weight loss, per chart review has been to the breast 9 times in past 24 hours for 10-20 minutes on average. 2 voids/4 stools in 24 hours. Mom reports some mild discomfort with nursing. Encouraged Mom to consider post pumping and giving baby back some extra EBM to minimize further weight loss. [facility] but Mom reports she would use [employer] Hand pump. Left phone number for Mom to call with next feeding so LC can assess milk transfer and help with feeding plan for d/c today. Mom reports she will call.   Maternal Data    Feeding Feeding Type: Breast Fed Length of feed: 15 min  LATCH Score/Interventions                      Lactation Tools Discussed/Used Tools: Pump Breast pump type: Double-Electric Breast Pump   Consult Status Consult Status: Follow-up Date: 10/05/15 Follow-up type: In-patient    Alfred LevinsGranger, Anielle Headrick Ann 10/05/2015, 9:02 AM

## 2016-07-27 IMAGING — US US OB COMP LESS 14 WK
1 series · 14 of 28 positions shown · non-contrast
Comparison: None.

ADDENDUM:
Typographical error in the original dictation. The correct EDC
should read as follows:

EDC:  10/27/2015.
CLINICAL DATA: Bleeding, cramping since 03/04/2015
EXAM:
OBSTETRIC <14 WK US AND TRANSVAGINAL OB US
TECHNIQUE: Both transabdominal and transvaginal ultrasound examinations were
performed for complete evaluation of the gestation as well as the
maternal uterus, adnexal regions, and pelvic cul-de-sac.
Transvaginal technique was performed to assess early pregnancy.

[Series 1: us ob follow up · 49 acquisitions, 14 frames shown]
[im 2/49]
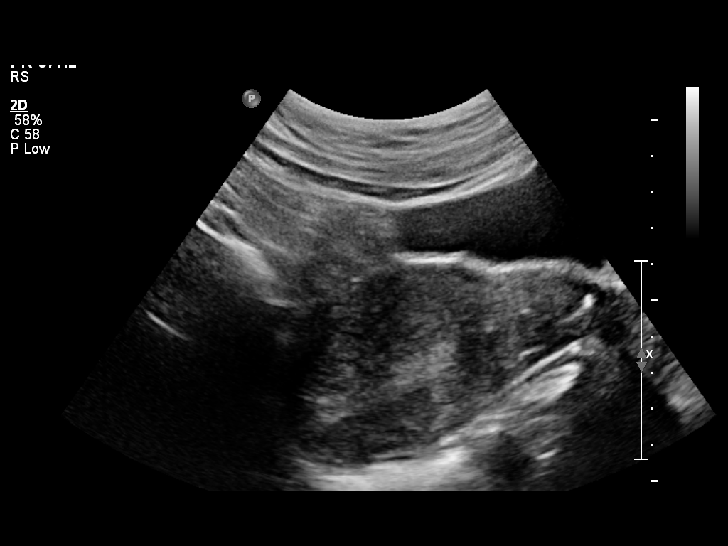
[im 6/49]
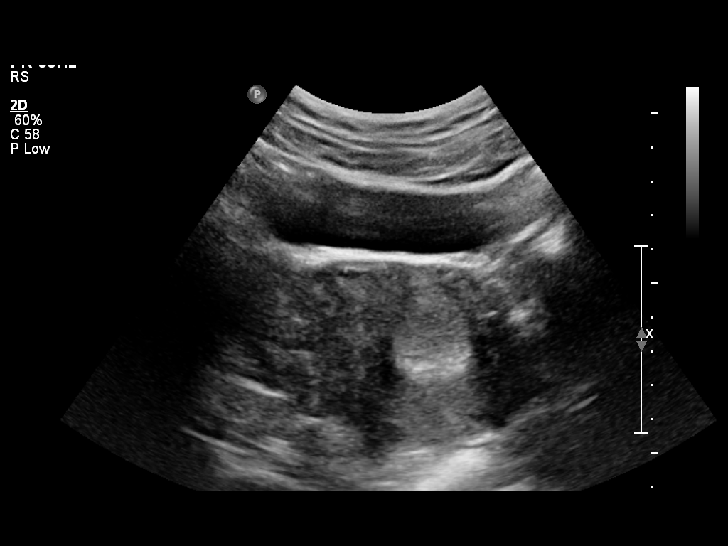
[im 9/49]
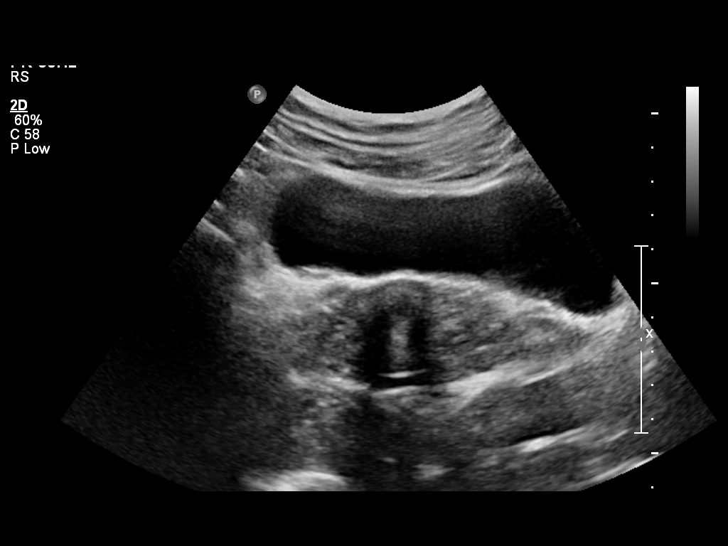
[im 13/49]
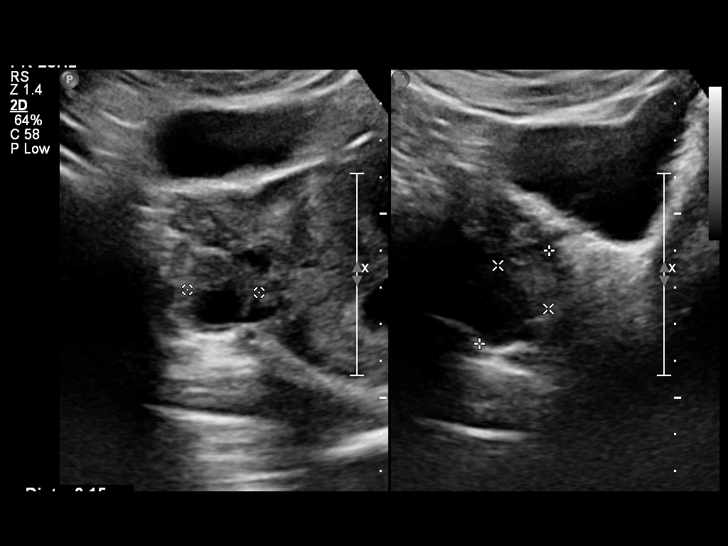
[im 17/49]
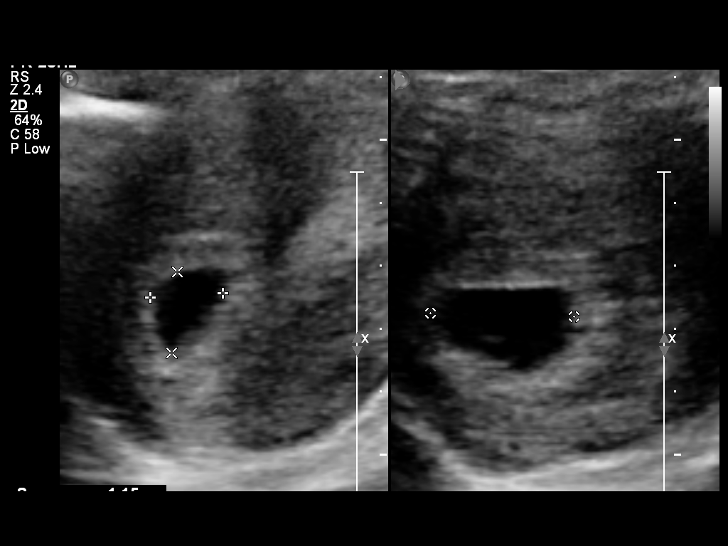
[im 20/49]
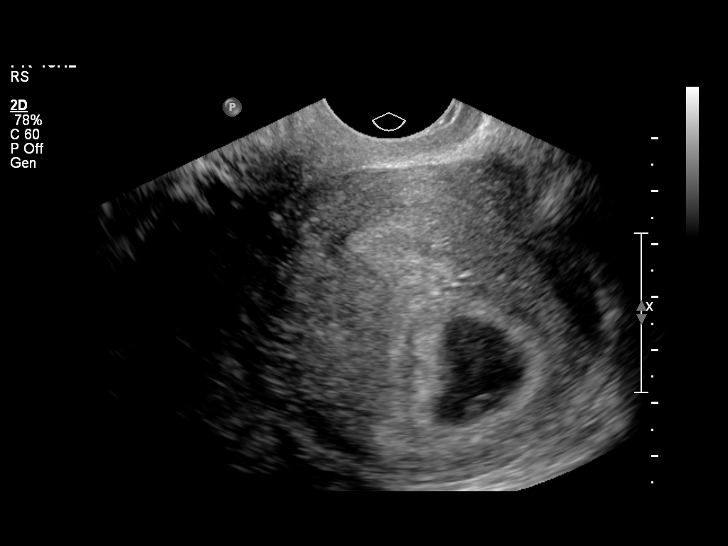
[im 24/49]
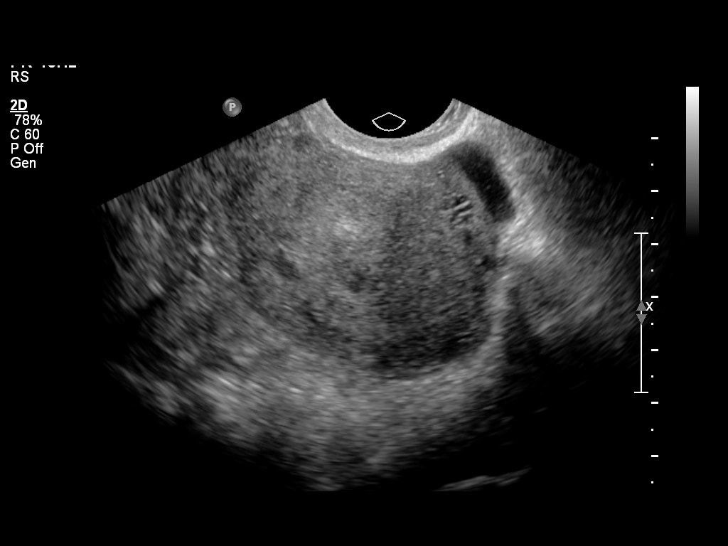
[im 27/49]
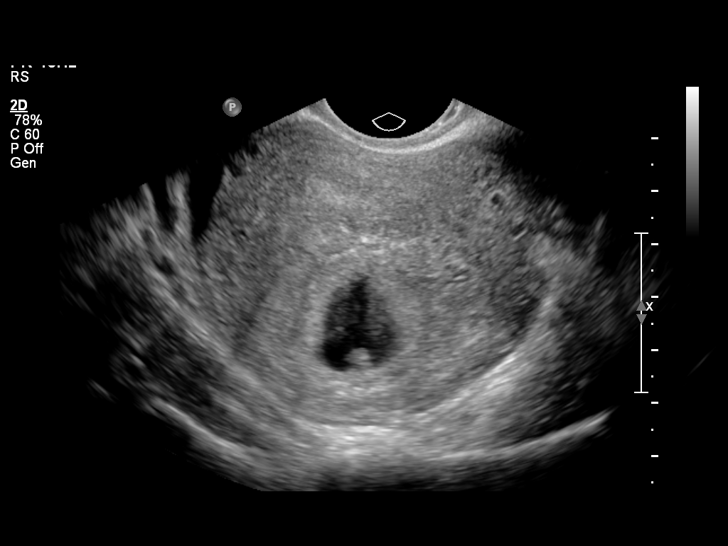
[im 31/49]
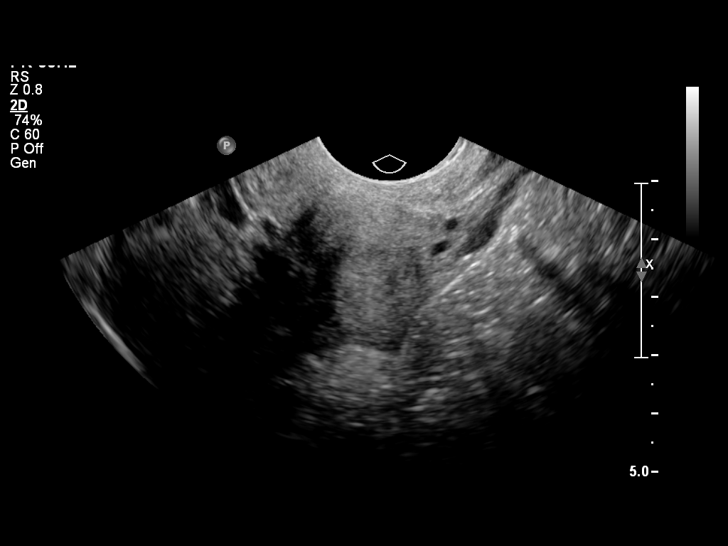
[im 34/49]
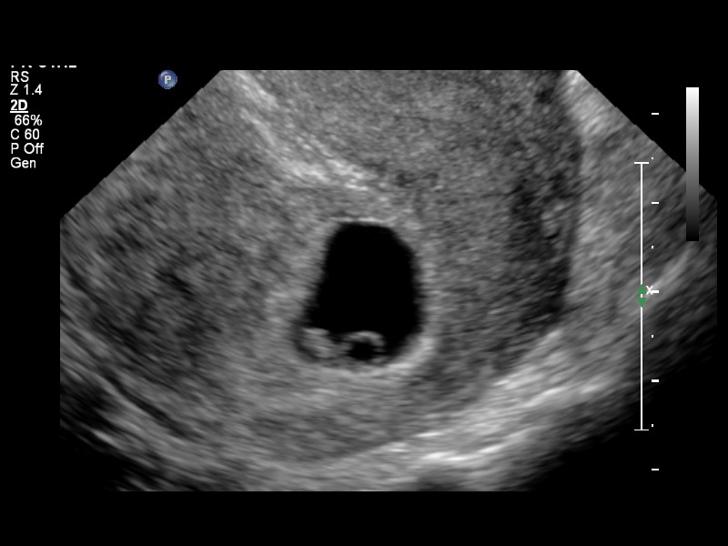
[im 38/49]
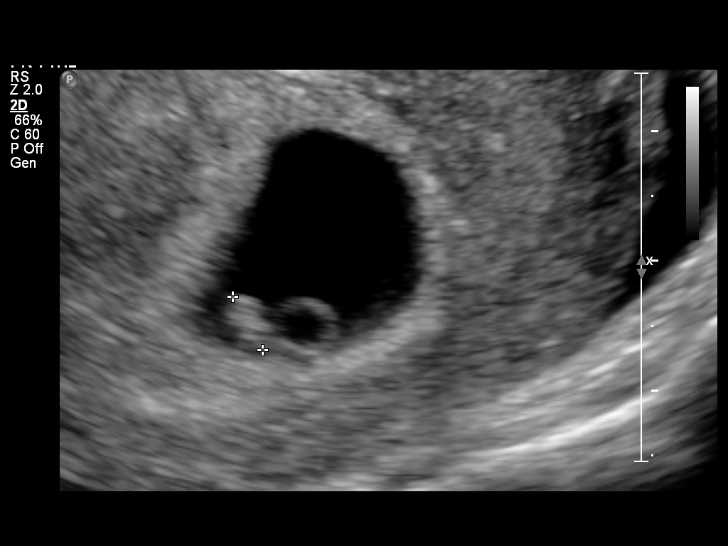
[im 41/49]
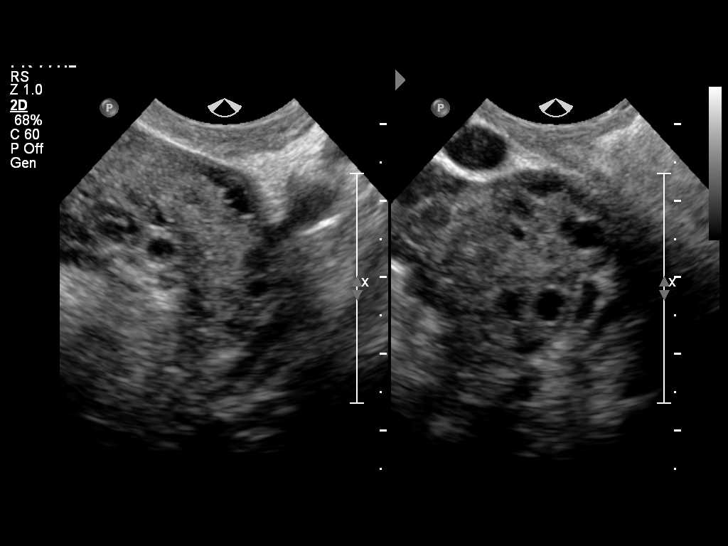
[im 45/49]
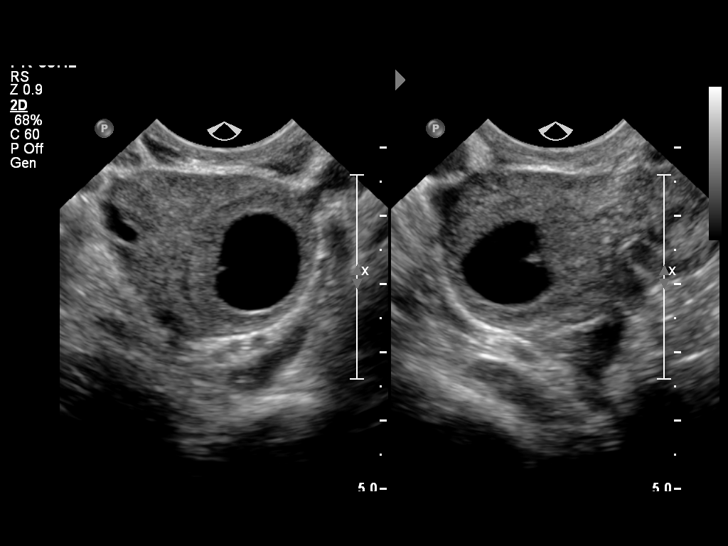
[im 49/49]
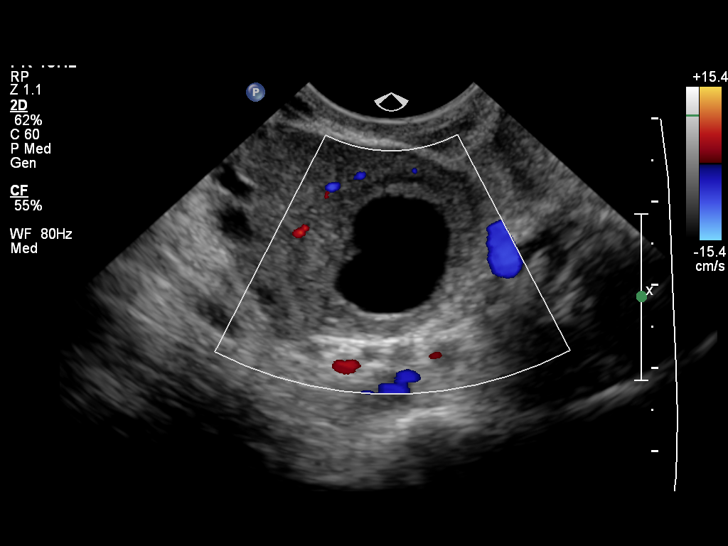

[14 of 28 positions shown; findings below may reference images not displayed]

FINDINGS: Intrauterine gestational sac: Visualized/normal in shape.

Yolk sac:  Present

Embryo:  Present

Cardiac Activity: Present

Heart Rate: 122  bpm

CRL:  4.6  mm   6 w   2 d                  US EDC: 10/27/2014

Maternal uterus/adnexae: Possible small subchorionic hemorrhage.
Normal bilateral ovaries. No adnexal mass. No pelvic free fluid.
IMPRESSION: 1. Single live intrauterine pregnancy as detailed above.
2. Possible small subchorionic hemorrhage versus artifact. Attention
on follow-up examinations is recommended.

## 2021-01-03 LAB — CYTOLOGY - PAP: Pap: NEGATIVE

## 2021-07-23 NOTE — L&D Delivery Note (Cosign Needed Addendum)
LABOR COURSE SOL, no epidural   Delivery Note Called to room and patient was complete and pushing. Precipitously delivered by nursing staff prior to providers arriving to room.  Head delivered ROA. No nuchal cord present. Shoulder and body delivered in usual fashion. At 0736 a viable female was delivered via Vaginal, Spontaneous by RN.  Infant with spontaneous cry, placed on mother's abdomen, dried and stimulated. Cord clamped x 2 after 1-minute delay, and cut by FOB. Cord blood drawn. Placenta delivered spontaneously with gentle cord traction. Appears intact. Fundus firm with massage and Pitocin. Labia, perineum, vagina, and cervix inspected with no lacerations. Post placental IUD placed by Dr. Nobie Putnam.   APGAR: 9, 9; weight pending .   Cord: 3VC   Anesthesia:  none Episiotomy: None Lacerations: none Est. Blood Loss (mL): 150  Mom to postpartum.  Baby to Couplet care / Skin to Skin.  [place] Chard, DO 02/19/22 7:57 AM     Fellow ATTESTATION  I was present and gloved for this delivery and agree with the above documentation in the resident's note except as below.  Celedonio Savage, MD [facility] [employer] [facility] (Faculty Practice) 02/19/2022, 8:29 AM

## 2021-08-28 LAB — OB RESULTS CONSOLE RUBELLA ANTIBODY, IGM
Rubella: IMMUNE
Rubella: IMMUNE

## 2021-08-28 LAB — OB RESULTS CONSOLE VARICELLA ZOSTER ANTIBODY, IGG: Varicella: IMMUNE

## 2021-08-28 LAB — OB RESULTS CONSOLE HGB/HCT, BLOOD
HCT: 38 (ref 29–41)
HCT: 38 (ref 29–41)
Hemoglobin: 11.9
Hemoglobin: 11.9

## 2021-08-28 LAB — OB RESULTS CONSOLE PLATELET COUNT
Platelets: 258
Platelets: 258

## 2021-08-28 LAB — OB RESULTS CONSOLE GC/CHLAMYDIA
Chlamydia: NEGATIVE
Chlamydia: NEGATIVE
Neisseria Gonorrhea: NEGATIVE
Neisseria Gonorrhea: NEGATIVE

## 2021-08-28 LAB — OB RESULTS CONSOLE HEPATITIS B SURFACE ANTIGEN: Hepatitis B Surface Ag: NEGATIVE

## 2021-08-28 LAB — OB RESULTS CONSOLE HIV ANTIBODY (ROUTINE TESTING)
HIV: NONREACTIVE
HIV: NONREACTIVE

## 2021-08-28 LAB — HEPATITIS C ANTIBODY
HCV Ab: NEGATIVE
HCV Ab: NEGATIVE

## 2021-08-28 LAB — OB RESULTS CONSOLE RPR
RPR: NONREACTIVE
RPR: NONREACTIVE

## 2021-09-20 ENCOUNTER — Inpatient Hospital Stay (HOSPITAL_COMMUNITY)
Admission: AD | Admit: 2021-09-20 | Discharge: 2021-09-20 | Disposition: A | Discharge status: Home / Self Care | Payer: Medicaid Other | Attending: Obstetrics & Gynecology | Admitting: Obstetrics & Gynecology

## 2021-09-20 ENCOUNTER — Other Ambulatory Visit: Payer: Self-pay

## 2021-09-20 ENCOUNTER — Encounter (HOSPITAL_COMMUNITY): Payer: Self-pay | Admitting: Obstetrics & Gynecology

## 2021-09-20 DIAGNOSIS — O09522 Supervision of elderly multigravida, second trimester: Secondary | ICD-10-CM | POA: Insufficient documentation

## 2021-09-20 DIAGNOSIS — O26892 Other specified pregnancy related conditions, second trimester: Secondary | ICD-10-CM | POA: Diagnosis present

## 2021-09-20 DIAGNOSIS — O4692 Antepartum hemorrhage, unspecified, second trimester: Secondary | ICD-10-CM | POA: Diagnosis not present

## 2021-09-20 DIAGNOSIS — Z3A15 15 weeks gestation of pregnancy: Secondary | ICD-10-CM | POA: Diagnosis not present

## 2021-09-20 DIAGNOSIS — Z6790 Unspecified blood type, Rh positive: Secondary | ICD-10-CM | POA: Insufficient documentation

## 2021-09-20 LAB — WET PREP, GENITAL
Clue Cells Wet Prep HPF POC: NONE SEEN
Sperm: NONE SEEN
Trich, Wet Prep: NONE SEEN
WBC, Wet Prep HPF POC: >=10 — AB (ref <10)
Yeast Wet Prep HPF POC: NONE SEEN

## 2021-09-20 LAB — URINALYSIS, ROUTINE W REFLEX MICROSCOPIC
Bilirubin Urine: NEGATIVE
Glucose, UA: NEGATIVE mg/dL
Hgb urine dipstick: NEGATIVE
Ketones, ur: NEGATIVE mg/dL
Leukocytes,Ua: NEGATIVE
Nitrite: NEGATIVE
Protein, ur: NEGATIVE mg/dL
Specific Gravity, Urine: 1.011 (ref 1.005–1.030)
pH: 8 (ref 5.0–8.0)

## 2021-09-20 NOTE — MAU Note (Signed)
Pt reports to MAU c/o headache, tired, not eating well, and some bleeding. Reports bladder pain. States bleeding is just a little bit and has not needed to wear a pad. States she started feeling bad yesterday but the bleeding has been ongoing for a month now. Denies taking anything for her headache. Denies any current pain. ? ?FHR 160 ?

## 2021-09-20 NOTE — MAU Provider Note (Signed)
?History  ?  ? ?[id number] ? ?Arrival date and time: 09/20/21 1904 ?  ? ?Chief Complaint  ?Patient presents with  ? Headache  ? Vaginal Bleeding  ? ? ? ?HPI ?Amber Le is a 36 y.o. at [redacted]w[redacted]d who presents for vaginal bleeding.  ?This has been an intermittent issue for the last month.  States occasionally she will see pink/red spotting in her underwear that mainly occurs in the mornings.  Also reports some occasional abdominal pain when sitting down while at work.  Currently no pain.  Denies dysuria, vaginal discharge, vaginal irritation.  Last intercourse was 4 days ago.  [facility]es [facility] the [facility] department for prenatal care, denies complications with this pregnancy. ? ?OB History   ? ? Gravida  ?3  ? Para  ?2  ? Term  ?2  ? Preterm  ?   ? AB  ?   ? Living  ?2  ?  ? ? SAB  ?   ? IAB  ?   ? Ectopic  ?   ? Multiple  ?0  ? Live Births  ?2  ?   ?  ?  ? ? ?Past Medical History:  ?Diagnosis Date  ? Medical history non-contributory   ? ? ?Past Surgical History:  ?Procedure Laterality Date  ? NO PAST SURGERIES    ? ? ?Family History  ?Problem Relation Age of Onset  ? Alcohol abuse Neg Hx   ? ? ?Allergies  ?Allergen Reactions  ? Avocado Other (See Comments)  ?  Throat itchiness  ? ? ?No current facility-administered medications on file prior to encounter.  ? ?Current Outpatient Medications on File Prior to Encounter  ?Medication Sig Dispense Refill  ? Prenatal Vit-Fe Fumarate-FA (WESTAB PLUS) 27-1 MG TABS Take 1 tablet by mouth daily.    ? ? ? ?ROS ?Pertinent positives and negative per HPI, all others reviewed and negative ? ?Physical Exam  ? ?BP (!) 91/55   Pulse 69   Temp 98.2 ?F (36.8 ?C) (Oral)   Resp 18   SpO2 100%  ? ?Patient Vitals for the past 24 hrs: ? BP Temp Temp src Pulse Resp SpO2  ?09/20/21 2235 (!) 91/55 98.2 ?F (36.8 ?C) Oral 69 -- --  ?09/20/21 1937 (!) 121/58 98.3 ?F (36.8 ?C) Oral 80 18 100 %  ? ? ?Physical Exam ?Vitals and nursing note reviewed. Exam conducted with a chaperone present.  ?Constitutional:   ?    General: She is not in acute distress. ?   Appearance: She is well-developed.  ?Eyes:  ?   General: No scleral icterus. ?   Extraocular Movements: Extraocular movements intact.  ?Pulmonary:  ?   Effort: Pulmonary effort is normal. No respiratory distress.  ?Abdominal:  ?   Palpations: Abdomen is soft.  ?   Tenderness: There is no abdominal tenderness.  ?Genitourinary: ?   General: Normal vulva.  ?   Exam position: Lithotomy position.  ?   Comments: Scant dark mucoid blood. No active bleeding. No abnormal discharge.  ?Skin: ?   General: Skin is warm and dry.  ?Neurological:  ?   Mental Status: She is alert.  ?Psychiatric:     ?   Mood and Affect: Mood normal.     ?   Behavior: Behavior normal.  ?  ? ?Cervical Exam ?Dilation: Closed ?Effacement (%): Thick ?Exam by:: Judeth Horn NP ? ? ?Labs ?Results for orders placed or performed during the hospital encounter of 09/20/21 (from the past 24 hour(s))  ?  Urinalysis, Routine w reflex microscopic Urine, Clean Catch     Status: Abnormal  ? Collection Time: 09/20/21  8:19 PM  ?Result Value Ref Range  ? Color, Urine YELLOW YELLOW  ? APPearance HAZY (A) CLEAR  ? Specific Gravity, Urine 1.011 1.005 - 1.030  ? pH 8.0 5.0 - 8.0  ? Glucose, UA NEGATIVE NEGATIVE mg/dL  ? Hgb urine dipstick NEGATIVE NEGATIVE  ? Bilirubin Urine NEGATIVE NEGATIVE  ? Ketones, ur NEGATIVE NEGATIVE mg/dL  ? Protein, ur NEGATIVE NEGATIVE mg/dL  ? Nitrite NEGATIVE NEGATIVE  ? Leukocytes,Ua NEGATIVE NEGATIVE  ?Wet prep, genital     Status: Abnormal  ? Collection Time: 09/20/21  9:49 PM  ?Result Value Ref Range  ? Yeast Wet Prep HPF POC NONE SEEN NONE SEEN  ? Trich, Wet Prep NONE SEEN NONE SEEN  ? Clue Cells Wet Prep HPF POC NONE SEEN NONE SEEN  ? WBC, Wet Prep HPF POC >=10 (A) <10  ? Sperm NONE SEEN   ? ? ?Imaging ?No results found. ? ?MAU Course  ?Procedures ?Lab Orders    ?     Wet prep, genital    ?     Urinalysis, Routine w reflex microscopic Urine, Clean Catch    ?No orders of the defined types were  placed in this encounter. ? ?Imaging Orders  ?No imaging studies ordered today  ? ? ?MDM ?FHT present via doppler ? ?Patient is Rh+.  On exam she has minimal blood and no active bleeding.  Cervix is closed and thick.  Urinalysis ordered and results reviewed.  Wet prep and GC/CT collected.  Wet prep results reviewed and are negative. ?Assessment and Plan  ? ?1. Vaginal bleeding in pregnancy, second trimester  ?-FHT present ?-RH positive ?-GC/CT pending ?-reviewed bleeding precautions & reasons to return to MAU  ?2. [redacted] weeks gestation of pregnancy  ?-keep scheduled f/u with OB  ? ? ? ?Judeth Horn, NP ?09/21/21 ?1:58 AM ? ? ?

## 2021-09-22 ENCOUNTER — Other Ambulatory Visit: Payer: Self-pay | Admitting: Obstetrics & Gynecology

## 2021-09-22 DIAGNOSIS — Z363 Encounter for antenatal screening for malformations: Secondary | ICD-10-CM

## 2021-09-22 LAB — GC/CHLAMYDIA PROBE AMP (~~LOC~~) NOT AT ARMC
Chlamydia: NEGATIVE
Comment: NEGATIVE
Comment: NORMAL
Neisseria Gonorrhea: NEGATIVE

## 2021-10-04 ENCOUNTER — Encounter: Payer: Self-pay | Admitting: *Deleted

## 2021-10-04 ENCOUNTER — Ambulatory Visit: Payer: Medicaid Other | Admitting: *Deleted

## 2021-10-04 ENCOUNTER — Ambulatory Visit: Payer: Medicaid Other | Attending: Obstetrics & Gynecology

## 2021-10-04 ENCOUNTER — Other Ambulatory Visit: Payer: Self-pay | Admitting: *Deleted

## 2021-10-04 ENCOUNTER — Other Ambulatory Visit: Payer: Self-pay

## 2021-10-04 VITALS — BP 96/51 | HR 74

## 2021-10-04 DIAGNOSIS — Z3689 Encounter for other specified antenatal screening: Secondary | ICD-10-CM | POA: Diagnosis present

## 2021-10-04 DIAGNOSIS — O09522 Supervision of elderly multigravida, second trimester: Secondary | ICD-10-CM

## 2021-10-04 DIAGNOSIS — Z3A19 19 weeks gestation of pregnancy: Secondary | ICD-10-CM | POA: Diagnosis not present

## 2021-10-04 DIAGNOSIS — Z363 Encounter for antenatal screening for malformations: Secondary | ICD-10-CM

## 2021-10-04 DIAGNOSIS — Z362 Encounter for other antenatal screening follow-up: Secondary | ICD-10-CM

## 2021-11-08 ENCOUNTER — Ambulatory Visit: Payer: Medicaid Other | Admitting: *Deleted

## 2021-11-08 ENCOUNTER — Other Ambulatory Visit: Payer: Self-pay | Admitting: *Deleted

## 2021-11-08 ENCOUNTER — Encounter: Payer: Self-pay | Admitting: *Deleted

## 2021-11-08 ENCOUNTER — Ambulatory Visit: Payer: Medicaid Other | Attending: Maternal & Fetal Medicine

## 2021-11-08 VITALS — BP 96/54 | HR 65

## 2021-11-08 DIAGNOSIS — O09522 Supervision of elderly multigravida, second trimester: Secondary | ICD-10-CM | POA: Diagnosis not present

## 2021-11-08 DIAGNOSIS — Z3689 Encounter for other specified antenatal screening: Secondary | ICD-10-CM

## 2021-11-08 DIAGNOSIS — Z3A24 24 weeks gestation of pregnancy: Secondary | ICD-10-CM | POA: Diagnosis not present

## 2021-11-08 DIAGNOSIS — O09523 Supervision of elderly multigravida, third trimester: Secondary | ICD-10-CM

## 2021-11-08 DIAGNOSIS — Z362 Encounter for other antenatal screening follow-up: Secondary | ICD-10-CM | POA: Diagnosis not present

## 2021-12-07 LAB — OB RESULTS CONSOLE HGB/HCT, BLOOD
HCT: 39 (ref 29–41)
Hemoglobin: 11.8

## 2021-12-07 LAB — GLUCOSE TOLERANCE, 1 HOUR
Glucose, GTT - 1 Hour: 192 (ref ?–200)
Glucose, GTT - 1 Hour: 192 (ref ?–200)

## 2021-12-07 LAB — OB RESULTS CONSOLE HIV ANTIBODY (ROUTINE TESTING): HIV: NONREACTIVE

## 2021-12-07 LAB — OB RESULTS CONSOLE RPR: RPR: NONREACTIVE

## 2021-12-11 LAB — GLUCOSE TOLERANCE, 3 HOURS
Glucose 1 Hour: 127
Glucose 3 Hour: 119
Glucose, 2 hour: 189
Glucose, Fasting: 69

## 2021-12-11 LAB — GLUCOSE TOLERANCE, 1 HOUR
Glucose 1 Hour: 127
Glucose 3 Hour: 119
Glucose, 2 hour: 189
Glucose, Fasting: 69

## 2022-01-04 ENCOUNTER — Ambulatory Visit: Payer: Medicaid Other

## 2022-01-04 ENCOUNTER — Ambulatory Visit: Payer: Medicaid Other | Attending: Maternal & Fetal Medicine

## 2022-01-24 ENCOUNTER — Ambulatory Visit: Payer: Medicaid Other | Attending: Maternal & Fetal Medicine

## 2022-01-24 ENCOUNTER — Ambulatory Visit: Payer: Medicaid Other | Admitting: *Deleted

## 2022-01-24 VITALS — BP 98/62 | HR 97

## 2022-01-24 DIAGNOSIS — Z3689 Encounter for other specified antenatal screening: Secondary | ICD-10-CM | POA: Diagnosis present

## 2022-01-24 DIAGNOSIS — Z3A35 35 weeks gestation of pregnancy: Secondary | ICD-10-CM | POA: Diagnosis not present

## 2022-01-24 DIAGNOSIS — O09523 Supervision of elderly multigravida, third trimester: Secondary | ICD-10-CM

## 2022-01-24 DIAGNOSIS — O24419 Gestational diabetes mellitus in pregnancy, unspecified control: Secondary | ICD-10-CM | POA: Insufficient documentation

## 2022-01-31 ENCOUNTER — Other Ambulatory Visit: Payer: Self-pay | Admitting: General Practice

## 2022-01-31 DIAGNOSIS — O24415 Gestational diabetes mellitus in pregnancy, controlled by oral hypoglycemic drugs: Secondary | ICD-10-CM

## 2022-02-01 LAB — OB RESULTS CONSOLE GBS: GBS: POSITIVE

## 2022-02-05 ENCOUNTER — Other Ambulatory Visit: Payer: Self-pay

## 2022-02-05 ENCOUNTER — Encounter: Payer: Medicaid Other | Attending: Family Medicine | Admitting: Registered"

## 2022-02-05 ENCOUNTER — Ambulatory Visit (INDEPENDENT_AMBULATORY_CARE_PROVIDER_SITE_OTHER): Payer: Medicaid Other | Admitting: Registered"

## 2022-02-05 DIAGNOSIS — Z713 Dietary counseling and surveillance: Secondary | ICD-10-CM | POA: Diagnosis not present

## 2022-02-05 DIAGNOSIS — O24415 Gestational diabetes mellitus in pregnancy, controlled by oral hypoglycemic drugs: Secondary | ICD-10-CM | POA: Diagnosis not present

## 2022-02-05 DIAGNOSIS — O24419 Gestational diabetes mellitus in pregnancy, unspecified control: Secondary | ICD-10-CM

## 2022-02-05 DIAGNOSIS — O99810 Abnormal glucose complicating pregnancy: Secondary | ICD-10-CM | POA: Insufficient documentation

## 2022-02-05 MED ORDER — ACCU-CHEK GUIDE W/DEVICE KIT: 1.0000 | PACK | Freq: Once | 0 refills | Status: AC

## 2022-02-05 MED ORDER — ACCU-CHEK SOFTCLIX LANCETS MISC: 12 refills | Status: DC

## 2022-02-05 MED ORDER — ACCU-CHEK GUIDE VI STRP: ORAL_STRIP | 12 refills | Status: DC

## 2022-02-05 NOTE — Progress Notes (Signed)
In-person interpreter Elliot Gurney from [facility]  Patient was seen for Gestational Diabetes self-management on 02/05/22  Start time 1130 and End time 1230   Estimated due date: 02/28/2022; [redacted]w[redacted]d  Clinical: Medications: reviewed Medical History: reviewed Labs: OGTT 1 hr at [facility] of 192, A1c n/a%   Dietary and Lifestyle History: Pt states she is used to eating 2+ cups of rice with meals and seemed concerned that she will be going hungry following the recommendations.   Patient did not have an understanding of food groups prior to appointment, but voiced understanding of protein foods and amounts recommended for pregnancy and to also help control blood sugar.  Physical Activity: not assessed Stress: not assessed Sleep: not assessed  24 hr Recall:  not assessed First Meal:  Snack: Second meal: Snack: Third meal: Snack: Beverages:  NUTRITION INTERVENTION  Nutrition education (E-1) on the following topics:   Initial Follow-up  [x]  []  Definition of Gestational Diabetes [x]  []  Why dietary management is important in controlling blood glucose [x]  []  Effects each nutrient has on blood glucose levels []  []  Simple carbohydrates vs complex carbohydrates [x]  []  Fluid intake [x]  []  Creating a balanced meal plan [x]  []  Carbohydrate counting  [x]  []  When to check blood glucose levels [x]  []  Proper blood glucose monitoring techniques [x]  []  Effect of stress and stress reduction techniques  [x]  []  Exercise effect on blood glucose levels, appropriate exercise during pregnancy []  []  Importance of limiting caffeine and abstaining from alcohol and smoking []  []  Medications used for blood sugar control during pregnancy []  []  Hypoglycemia and rule of 15 [x]  []  Postpartum self care  Patient does not have a meter, used demo to teach SMBG for Accu chek.  Patient instructed to monitor glucose levels: FBS: 60 - ? 95 mg/dL (some clinics use 90 for cutoff) 1 hour: ? 140 mg/dL 2 hour: ?  mg/dL  Patient received handouts: Nutrition Diabetes and Pregnancy Carbohydrate Counting List Accu chek flyer for free meter  Patient will be seen for follow-up as needed.

## 2022-02-14 ENCOUNTER — Other Ambulatory Visit: Payer: Self-pay

## 2022-02-14 ENCOUNTER — Encounter: Payer: Self-pay | Admitting: Family Medicine

## 2022-02-14 ENCOUNTER — Ambulatory Visit (INDEPENDENT_AMBULATORY_CARE_PROVIDER_SITE_OTHER): Payer: Medicaid Other | Admitting: Family Medicine

## 2022-02-14 ENCOUNTER — Other Ambulatory Visit (HOSPITAL_COMMUNITY)
Admission: RE | Admit: 2022-02-14 | Discharge: 2022-02-14 | Disposition: A | Discharge status: Home / Self Care | Payer: Medicaid Other | Source: Ambulatory Visit | Attending: Family Medicine | Admitting: Family Medicine

## 2022-02-14 VITALS — BP 107/71 | HR 82 | Wt 164.0 lb

## 2022-02-14 DIAGNOSIS — N898 Other specified noninflammatory disorders of vagina: Secondary | ICD-10-CM | POA: Diagnosis present

## 2022-02-14 DIAGNOSIS — O99810 Abnormal glucose complicating pregnancy: Secondary | ICD-10-CM

## 2022-02-14 DIAGNOSIS — O99820 Streptococcus B carrier state complicating pregnancy: Secondary | ICD-10-CM | POA: Diagnosis not present

## 2022-02-14 DIAGNOSIS — O0990 Supervision of high risk pregnancy, unspecified, unspecified trimester: Secondary | ICD-10-CM | POA: Diagnosis not present

## 2022-02-14 NOTE — Progress Notes (Signed)
   New OB -- LATE TRANSFER PRENATAL VISIT NOTE  Subjective:  Amber Le is a 36 y.o. G3P2002 at [redacted]w[redacted]d being seen today for ongoing prenatal care.  She is currently monitored for the following issues for this high-risk pregnancy and has Abnormal glucose tolerance test (GTT) during pregnancy, antepartum; Supervision of high risk pregnancy, antepartum; and GBS (group B Streptococcus carrier), +RV culture, currently pregnant on their problem list.  Patient reports occasional contractions.  Contractions: Irritability. Vag. Bleeding: None.  Movement: (!) Decreased. Denies leaking of fluid.   The following portions of the patient's history were reviewed and updated as appropriate: allergies, current medications, past family history, past medical history, past social history, past surgical history and problem list.   Objective:   Vitals:   02/14/22 0943  BP: 107/71  Pulse: 82  Weight: 164 lb (74.4 kg)    Fetal Status: Fetal Heart Rate (bpm): 160   Movement: (!) Decreased  Presentation: Vertex  General:  Alert, oriented and cooperative. Patient is in no acute distress.  Skin: Skin is warm and dry. No rash noted.   Cardiovascular: Normal heart rate noted  Respiratory: Normal respiratory effort, no problems with respiration noted  Abdomen: Soft, gravid, appropriate for gestational age.  Pain/Pressure: Absent     Pelvic: Cervical exam performed in the presence of a chaperone Dilation: 3.5 Effacement (%): 70 Station: -2- Green/yellow discharge.   Extremities: Normal range of motion.     Mental Status: Normal mood and affect. Normal behavior. Normal judgment and thought content.   Assessment and Plan:  Pregnancy: G3P2002 at [redacted]w[redacted]d  1. Supervision of high risk pregnancy, antepartum Patient thinks she has had 10 movements since arrival at [facility] today which is reassuring.   Instructed to perform formal kick counts at home today-- laying on left, drinking water, 10 in 2 hours. Patient instructed to go  to hospital if not at 10 movements   Cervical exam shows a very favorable cervix, likely to go into labor before  Reviewed when to go to the hospital in detail with teach back  2. GBS (group B Streptococcus carrier), +RV culture, currently pregnant Needs PCN in labor  3. Abnormal glucose tolerance test (GTT) during pregnancy, antepartum - Checking BG-- Fastings 80s, PP 100s, nothing over 204 - [place] at 35 wk showed-- Est. FW:    2803  gm      6 lb 3 oz     74  %   Term labor symptoms and general obstetric precautions including but not limited to vaginal bleeding, contractions, leaking of fluid and fetal movement were reviewed in detail with the patient. Please refer to After Visit Summary for other counseling recommendations.   Return in about 1 week (around 02/21/2022) for Routine prenatal care.  Future Appointments  Date Time Provider Department Center  02/22/2022  1:35 PM Reva Bores, MD [facility]    Federico Flake, MD

## 2022-02-14 NOTE — Progress Notes (Signed)
Using assigned interpreter "Tresa Endo"  Patient reports contractions that happen "every other day". She informed me the contractions will last  "less than 1 minute" and then stop.    Patient also stated that she experienced "a lot" of vaginal discharge yesterday. She described it as "thick, yellow in color and odorless".   Fetal movement has been described as "less than before" and has been like that for about "4 days" but stated that she still does feel baby move

## 2022-02-19 ENCOUNTER — Encounter (HOSPITAL_COMMUNITY): Payer: Self-pay | Admitting: Family Medicine

## 2022-02-19 ENCOUNTER — Other Ambulatory Visit: Payer: Self-pay

## 2022-02-19 ENCOUNTER — Inpatient Hospital Stay (HOSPITAL_COMMUNITY)
Admission: AD | Admit: 2022-02-19 | Discharge: 2022-02-21 | DRG: 807 | Disposition: A | Discharge status: Home / Self Care | Payer: Medicaid Other | Attending: Family Medicine | Admitting: Family Medicine

## 2022-02-19 DIAGNOSIS — O99820 Streptococcus B carrier state complicating pregnancy: Secondary | ICD-10-CM

## 2022-02-19 DIAGNOSIS — O99824 Streptococcus B carrier state complicating childbirth: Secondary | ICD-10-CM | POA: Diagnosis present

## 2022-02-19 DIAGNOSIS — Z30430 Encounter for insertion of intrauterine contraceptive device: Secondary | ICD-10-CM | POA: Diagnosis not present

## 2022-02-19 DIAGNOSIS — Z3A38 38 weeks gestation of pregnancy: Secondary | ICD-10-CM

## 2022-02-19 DIAGNOSIS — Z79899 Other long term (current) drug therapy: Secondary | ICD-10-CM | POA: Diagnosis not present

## 2022-02-19 DIAGNOSIS — O0990 Supervision of high risk pregnancy, unspecified, unspecified trimester: Principal | ICD-10-CM

## 2022-02-19 DIAGNOSIS — O24420 Gestational diabetes mellitus in childbirth, diet controlled: Secondary | ICD-10-CM | POA: Diagnosis present

## 2022-02-19 DIAGNOSIS — Z91018 Allergy to other foods: Secondary | ICD-10-CM

## 2022-02-19 DIAGNOSIS — O99810 Abnormal glucose complicating pregnancy: Secondary | ICD-10-CM

## 2022-02-19 LAB — RPR: RPR Ser Ql: NONREACTIVE

## 2022-02-19 LAB — TYPE AND SCREEN
ABO/RH(D): B POS
Antibody Screen: NEGATIVE

## 2022-02-19 LAB — CBC
HCT: 41.3 % (ref 36.0–46.0)
Hemoglobin: 13.4 g/dL (ref 12.0–15.0)
MCH: 28 pg (ref 26.0–34.0)
MCHC: 32.4 g/dL (ref 30.0–36.0)
MCV: 86.2 fL (ref 80.0–100.0)
Platelets: 225 10*3/uL (ref 150–400)
RBC: 4.79 MIL/uL (ref 3.87–5.11)
RDW: 15.2 % (ref 11.5–15.5)
WBC: 8.1 10*3/uL (ref 4.0–10.5)
nRBC: 0 % (ref 0.0–0.2)

## 2022-02-19 LAB — CERVICOVAGINAL ANCILLARY ONLY
Bacterial Vaginitis (gardnerella): NEGATIVE
Candida Glabrata: NEGATIVE
Candida Vaginitis: POSITIVE — AB
Comment: NEGATIVE
Comment: NEGATIVE
Comment: NEGATIVE
Comment: NEGATIVE
Trichomonas: NEGATIVE

## 2022-02-19 LAB — GLUCOSE, CAPILLARY: Glucose-Capillary: 117 mg/dL — ABNORMAL HIGH (ref 70–99)

## 2022-02-19 MED ORDER — OXYTOCIN-SODIUM CHLORIDE 30-0.9 UT/500ML-% IV SOLN
2.5000 [IU]/h | INTRAVENOUS | Status: DC
  Administered 2022-02-19: 2.5 [IU]/h via INTRAVENOUS
  Filled 2022-02-19: qty 500

## 2022-02-19 MED ORDER — LACTATED RINGERS IV SOLN: 500.0000 mL | INTRAVENOUS | Status: DC | PRN

## 2022-02-19 MED ORDER — OXYTOCIN BOLUS FROM INFUSION
333.0000 mL | Freq: Once | INTRAVENOUS | Status: AC
  Administered 2022-02-19: 333 mL via INTRAVENOUS

## 2022-02-19 MED ORDER — SENNOSIDES-DOCUSATE SODIUM 8.6-50 MG PO TABS
2.0000 | ORAL_TABLET | Freq: Every day | ORAL | Status: DC
  Administered 2022-02-20 – 2022-02-21 (×2): 2 via ORAL
  Filled 2022-02-19 (×2): qty 2

## 2022-02-19 MED ORDER — TETANUS-DIPHTH-ACELL PERTUSSIS 5-2.5-18.5 LF-MCG/0.5 IM SUSY: 0.5000 mL | PREFILLED_SYRINGE | Freq: Once | INTRAMUSCULAR | Status: DC

## 2022-02-19 MED ORDER — ACETAMINOPHEN 325 MG PO TABS
650.0000 mg | ORAL_TABLET | ORAL | Status: DC | PRN
  Administered 2022-02-19: 650 mg via ORAL
  Filled 2022-02-19: qty 2

## 2022-02-19 MED ORDER — LIDOCAINE HCL (PF) 1 % IJ SOLN: 30.0000 mL | INTRAMUSCULAR | Status: DC | PRN

## 2022-02-19 MED ORDER — SODIUM CHLORIDE 0.9 % IV SOLN: 1.0000 g | INTRAVENOUS | Status: DC

## 2022-02-19 MED ORDER — ONDANSETRON HCL 4 MG/2ML IJ SOLN: 4.0000 mg | INTRAMUSCULAR | Status: DC | PRN

## 2022-02-19 MED ORDER — OXYCODONE-ACETAMINOPHEN 5-325 MG PO TABS: 1.0000 | ORAL_TABLET | ORAL | Status: DC | PRN

## 2022-02-19 MED ORDER — DIPHENHYDRAMINE HCL 25 MG PO CAPS: 25.0000 mg | ORAL_CAPSULE | Freq: Four times a day (QID) | ORAL | Status: DC | PRN

## 2022-02-19 MED ORDER — SODIUM CHLORIDE 0.9 % IV SOLN
2.0000 g | Freq: Once | INTRAVENOUS | Status: AC
  Administered 2022-02-19: 2 g via INTRAVENOUS
  Filled 2022-02-19: qty 2000

## 2022-02-19 MED ORDER — OXYCODONE-ACETAMINOPHEN 5-325 MG PO TABS: 2.0000 | ORAL_TABLET | ORAL | Status: DC | PRN

## 2022-02-19 MED ORDER — ONDANSETRON HCL 4 MG PO TABS: 4.0000 mg | ORAL_TABLET | ORAL | Status: DC | PRN

## 2022-02-19 MED ORDER — COCONUT OIL OIL: 1.0000 | TOPICAL_OIL | Status: DC | PRN

## 2022-02-19 MED ORDER — PRENATAL MULTIVITAMIN CH
1.0000 | ORAL_TABLET | Freq: Every day | ORAL | Status: DC
Start: 2022-02-19 — End: 2022-02-21
  Administered 2022-02-19 – 2022-02-20 (×2): 1 via ORAL
  Filled 2022-02-19 (×3): qty 1

## 2022-02-19 MED ORDER — LACTATED RINGERS IV SOLN: INTRAVENOUS | Status: DC

## 2022-02-19 MED ORDER — WITCH HAZEL-GLYCERIN EX PADS: 1.0000 | MEDICATED_PAD | CUTANEOUS | Status: DC | PRN

## 2022-02-19 MED ORDER — ZOLPIDEM TARTRATE 5 MG PO TABS: 5.0000 mg | ORAL_TABLET | Freq: Every evening | ORAL | Status: DC | PRN

## 2022-02-19 MED ORDER — SOD CITRATE-CITRIC ACID 500-334 MG/5ML PO SOLN: 30.0000 mL | ORAL | Status: DC | PRN

## 2022-02-19 MED ORDER — IBUPROFEN 600 MG PO TABS
600.0000 mg | ORAL_TABLET | Freq: Four times a day (QID) | ORAL | Status: DC
Start: 2022-02-19 — End: 2022-02-21
  Administered 2022-02-19 – 2022-02-21 (×8): 600 mg via ORAL
  Filled 2022-02-19 (×8): qty 1

## 2022-02-19 MED ORDER — LEVONORGESTREL 20 MCG/DAY IU IUD
1.0000 | INTRAUTERINE_SYSTEM | Freq: Once | INTRAUTERINE | Status: AC
  Administered 2022-02-19: 1 via INTRAUTERINE
  Filled 2022-02-19: qty 1

## 2022-02-19 MED ORDER — DIBUCAINE (PERIANAL) 1 % EX OINT: 1.0000 | TOPICAL_OINTMENT | CUTANEOUS | Status: DC | PRN

## 2022-02-19 MED ORDER — ONDANSETRON HCL 4 MG/2ML IJ SOLN: 4.0000 mg | Freq: Four times a day (QID) | INTRAMUSCULAR | Status: DC | PRN

## 2022-02-19 MED ORDER — BENZOCAINE-MENTHOL 20-0.5 % EX AERO
1.0000 | INHALATION_SPRAY | CUTANEOUS | Status: DC | PRN
  Administered 2022-02-19: 1 via TOPICAL
  Filled 2022-02-19: qty 56

## 2022-02-19 MED ORDER — ACETAMINOPHEN 325 MG PO TABS: 650.0000 mg | ORAL_TABLET | ORAL | Status: DC | PRN

## 2022-02-19 MED ORDER — SIMETHICONE 80 MG PO CHEW: 80.0000 mg | CHEWABLE_TABLET | ORAL | Status: DC | PRN

## 2022-02-19 NOTE — H&P (Cosign Needed Addendum)
OBSTETRIC ADMISSION HISTORY AND PHYSICAL  Amber Le is a 36 y.o. female G3P2002 with IUP at [redacted]w[redacted]d by U/S presenting for SOL. She reports +FMs, No LOF, no VB, no blurry vision, headaches or peripheral edema, and RUQ pain.  She plans on breast feeding. She requests IUD PP for birth control. She received her prenatal care at  [facility]    Dating: By U/S --->  Estimated Date of Delivery: 02/28/22  Sono:    @[redacted]w[redacted]d , CWD, normal anatomy, vertex presentation,  2803g, 74% EFW   Prenatal History/Complications: A1GDM, GBS positive, + RV culture   Past Medical History: Past Medical History:  Diagnosis Date   Medical history non-contributory     Past Surgical History: Past Surgical History:  Procedure Laterality Date   NO PAST SURGERIES      Obstetrical History: OB History     Gravida  3   Para  2   Term  2   Preterm      AB      Living  2      SAB      IAB      Ectopic      Multiple  0   Live Births  2           Social History Social History   Socioeconomic History   Marital status: Married    Spouse name: Not on file   Number of children: Not on file   Years of education: Not on file   Highest education level: Not on file  Occupational History   Not on file  Tobacco Use   Smoking status: Never   Smokeless tobacco: Not on file  Vaping Use   Vaping Use: Never used  Substance and Sexual Activity   Alcohol use: No   Drug use: No   Sexual activity: Yes    Comment: pregnancy  Other Topics Concern   Not on file  Social History Narrative   Not on file   Social Determinants of Health   Financial Resource Strain: Not on file  Food Insecurity: Not on file  Transportation Needs: Not on file  Physical Activity: Not on file  Stress: Not on file  Social Connections: Not on file    Family History: Family History  Problem Relation Age of Onset   Alcohol abuse Neg Hx     Allergies: Allergies  Allergen Reactions   Avocado Other (See Comments)     Throat itchiness    Pt denies allergies to latex, iodine, or shellfish.  Medications Prior to Admission  Medication Sig Dispense Refill Last Dose   Prenatal Vit-Fe Fumarate-FA (WESTAB PLUS) 27-1 MG TABS Take 1 tablet by mouth daily.   Past Week   Accu-Chek Softclix Lancets lancets Use as instructed QID 100 each 12    glucose blood (ACCU-CHEK GUIDE) test strip Use as instructed QID 100 each 12    Review of Systems   All systems reviewed and negative except as stated in HPI  Blood pressure 120/72, pulse 65, temperature 97.9 F (36.6 C), temperature source Oral, resp. rate 16, last menstrual period 05/01/2021, SpO2 97 %, unknown if currently breastfeeding. General appearance: alert and cooperative Lungs: clear to auscultation bilaterally Heart: regular rate and rhythm Abdomen: soft, non-tender; bowel sounds normal Extremities: Homans sign is negative, no sign of DVT Presentation: cephalic Fetal monitoringBaseline: 150 bpm, Variability: Good {> 6 bpm), Accelerations: Reactive, and Decelerations: Absent Uterine activityFrequency: Every 2-3 minutes Dilation: 7 Effacement (%): 90 Exam by:: [email or web address],  RN   Prenatal labs: ABO, Rh:   Antibody:   Rubella: Immune, Immune (02/06 0000) RPR: Nonreactive (05/18 0000)  HBsAg: Negative (02/06 0000)  HIV: Non-reactive (05/18 0000)  GBS: Positive/-- (07/13 0000)  1 hr Glucola: abnormal GTT, diet controled  Genetic screening:  declined Anatomy US: normal   Prenatal Transfer Tool  Maternal Diabetes: Yes:  Diabetes Type:  Diet controlled Genetic Screening: Declined Maternal Ultrasounds/Referrals: Normal Fetal Ultrasounds or other Referrals:  None Maternal Substance Abuse:  No Significant Maternal Medications:  None Significant Maternal Lab Results: Group B Strep positive  No results found for this or any previous visit (from the past 24 hour(s)).  Patient Active Problem List   Diagnosis Date Noted   Supervision of high risk  pregnancy, antepartum 02/14/2022   GBS (group B Streptococcus carrier), +RV culture, currently pregnant 02/14/2022   Abnormal glucose tolerance test (GTT) during pregnancy, antepartum 02/05/2022    Assessment/Plan:  Amber Le is a 36 y.o. G3P2002 at [redacted]w[redacted]d here for SOL  #Labor: expectant management, AROM when adequately treated for GBS  #Pain: No epidural, IV pain meds to be offered.  #FWB: Cat 1 #ID: GBS positive > ampicillin #MOF: breast #MOC: IUD PP  Amber Chard, DO  Center for [facility], [facility] 02/19/2022, 6:15 AM    Attestation of CNM Supervision of Resident: Evaluation and management procedures were performed by the [facility] Medicine Resident under my supervision. I was immediately available for direct supervision, assistance and direction throughout this encounter.  I also confirm that I have verified the information documented in the resident's note, and that I have also personally reperformed the pertinent components of the physical exam and all of the medical decision making activities.  I have also made any necessary editorial changes.   Amber SNEDEKER, CNM 02/19/2022 6:39 AM

## 2022-02-19 NOTE — Lactation Note (Addendum)
This note was copied from a baby's chart. Lactation Consultation Note  Patient Name: Amber Le RSWNI'O Date: 02/19/2022 Reason for consult: Initial assessment;Mother's request;Difficult latch;Early term 37-38.6wks;Breastfeeding assistance Age:38 hours LC spoke with family with help of [place] translator Neo [phone]  Birth parent latching in sideline without head support with soreness of nipples. LC changed latch to cross cradle prone with signs of milk transfer, pain resolved.   Infant feeding based on cues 8-12x 24hr period.  Birth parent to offer breasts Support person charting feedings and output.  Birth parent to call for latch assistance for next feeding.   Infant able to extend tongue pass the gumline.   Maternal Data Has patient been taught Hand Expression?: Yes Does the patient have breastfeeding experience prior to this delivery?: Yes How long did the patient breastfeed?: 2 months with second child before starting formula  Feeding Mother's Current Feeding Choice: Breast Milk  LATCH Score Latch: Repeated attempts needed to sustain latch, nipple held in mouth throughout feeding, stimulation needed to elicit sucking reflex.  Audible Swallowing: Spontaneous and intermittent  Type of Nipple: Everted at rest and after stimulation  Comfort (Breast/Nipple): Soft / non-tender  Hold (Positioning): Assistance needed to correctly position infant at breast and maintain latch.  LATCH Score: 8   Lactation Tools Discussed/Used    Interventions Interventions: Breast feeding basics reviewed;Assisted with latch;Skin to skin;Breast massage;Hand express;Breast compression;Adjust position;Support pillows;Position options;Expressed milk;Education;LC Psychologist, educational;Infant Driven Feeding Algorithm education  Discharge Pump: Personal WIC Program: Yes  Consult Status Consult Status: Follow-up Date: 02/20/22 Follow-up type: In-patient    Amber Betty   Le 02/19/2022, 3:17 PM

## 2022-02-19 NOTE — Progress Notes (Signed)
[employer] interpreter [place] used for admission education/paperwork, food order, pain. All questions answered for patient at this time.

## 2022-02-19 NOTE — Lactation Note (Signed)
This note was copied from a baby's chart. Lactation Consultation Note  Patient Name: Amber Le [id number] Date: 02/19/2022 Reason for consult: L&D Initial assessment;Early term 37-38.6wks;Maternal endocrine disorder Age:37 hours  P3 Mom  LC to L&D to assist with first feeding.  [employer] interpreter being used to help with communication. Baby STS on chest and crying softly. When baby moved over breast, baby didn't root or open her mouth. Reviewed breast massage and hand expression, no colostrum at present.  Mom with compressible areola and erect nipples, but baby not interested in feeding currently.    After a few attempts, placed baby STS on Mom's chest and baby fell asleep.  Both Mom and baby resting.  Encouraged STS as much as possible and ask for help prn.  Maternal Data Has patient been taught Hand Expression?: Yes Does the patient have breastfeeding experience prior to this delivery?: Yes How long did the patient breastfeed?: 6 months with 2nd baby  Feeding Mother's Current Feeding Choice: Breast Milk  LATCH Score Latch: Too sleepy or reluctant, no latch achieved, no sucking elicited.  Audible Swallowing: None  Type of Nipple: Everted at rest and after stimulation  Comfort (Breast/Nipple): Soft / non-tender  Hold (Positioning): Full assist, staff holds infant at breast  LATCH Score: 4   Interventions Interventions: Hand express;Breast massage;Assisted with latch;Skin to skin;Adjust position  Consult Status Consult Status: Follow-up from L&D Date: 02/19/22 Follow-up type: In-patient    Amber Le 02/19/2022, 8:21 AM

## 2022-02-19 NOTE — Procedures (Signed)
  Post-Placental IUD Insertion Procedure Note  Patient identified, informed consent signed prior to delivery, signed copy in chart, time out was performed.    Vaginal, labial and perineal areas thoroughly inspected for lacerations. No laceration identified. Mirena  - IUD grasped between sterile gloved fingers. Sterile lubrication applied to sterile gloved hand for ease of insertion. Fundus identified through abdominal wall using non-insertion hand. IUD inserted to fundus with bimanual technique. IUD carefully released at the fundus and insertion hand gently removed from vagina.   Strings trimmed to the level of the introitus. Patient tolerated procedure well.  Lot # [id number] Expiration Date Oct 2025  Patient given post procedure instructions and IUD care card with expiration date.  Patient is asked to keep IUD strings tucked in her vagina until her postpartum follow up visit in 4-6 weeks. Patient advised to abstain from sexual intercourse and pulling on strings before her follow-up visit. Patient verbalized an understanding of the plan of care and agrees.

## 2022-02-19 NOTE — Lactation Note (Signed)
This note was copied from a baby's chart. Lactation Consultation Note  Patient Name: Girl Saachi Zale [id number] Date: 02/19/2022   Age:38 hours LC went in to see how breastfeeding going. RN, Casper Harrison stated birth parent completed a feeding at 1015 am. Both infant and birth parent resting at this time. LC to return later to assist with breastfeeding.  Maternal Data    Feeding    LATCH Score                    Lactation Tools Discussed/Used    Interventions    Discharge    Consult Status      Kinza Gouveia  Nicholson-Springer 02/19/2022, 12:35 PM

## 2022-02-19 NOTE — MAU Note (Signed)
..  Amber Le is a 36 y.o. at [redacted]w[redacted]d here in MAU reporting: contractions that began at 2:30am and are now every 20 minutes. Denies vaginal bleeding or leaking of fluid. +FM Last SVE on Thursday 3cm  Pain score: 10/10 Vitals:   02/19/22 0608  BP: 120/72  Pulse: 65  Resp: 16  Temp: 97.9 F (36.6 C)  SpO2: 97%     JIR:[id number] in room 154

## 2022-02-20 LAB — GLUCOSE, CAPILLARY: Glucose-Capillary: 85 mg/dL (ref 70–99)

## 2022-02-20 LAB — CBC
HCT: 35.7 % — ABNORMAL LOW (ref 36.0–46.0)
Hemoglobin: 12 g/dL (ref 12.0–15.0)
MCH: 28 pg (ref 26.0–34.0)
MCHC: 33.6 g/dL (ref 30.0–36.0)
MCV: 83.4 fL (ref 80.0–100.0)
Platelets: 193 10*3/uL (ref 150–400)
RBC: 4.28 MIL/uL (ref 3.87–5.11)
RDW: 15.3 % (ref 11.5–15.5)
WBC: 11.3 10*3/uL — ABNORMAL HIGH (ref 4.0–10.5)
nRBC: 0 % (ref 0.0–0.2)

## 2022-02-20 NOTE — Progress Notes (Signed)
POSTPARTUM PROGRESS NOTE  Post Partum Day 1  Subjective:  Amber Le is a 36 y.o. G3P3003 s/p SVD at [redacted]w[redacted]d.  She reports she is doing well. No acute events overnight. She denies any problems with ambulating, voiding or po intake. Denies nausea or vomiting.  Pain is well controlled.  Lochia is minimal.  Objective: Blood pressure (!) 91/47, pulse 76, temperature 98.4 F (36.9 C), temperature source Oral, resp. rate 19, height 4' 11.06" (1.5 m), weight 74.4 kg, last menstrual period 05/01/2021, SpO2 100 %, unknown if currently breastfeeding.  Physical Exam:  General: alert, cooperative and no distress Chest: no respiratory distress Heart:regular rate, distal pulses intact Abdomen: soft, nontender,  Uterine Fundus: firm, appropriately tender DVT Evaluation: No calf swelling or tenderness Extremities: no edema Skin: warm, dry  Recent Labs    02/19/22 0619 02/20/22 0521  HGB 13.4 12.0  HCT 41.3 35.7*    Assessment/Plan: Amber Le is a 36 y.o. G3P3003 s/p SVD at [redacted]w[redacted]d   PPD#1 - Doing well  Routine postpartum care GBS positive with inadequate prophylaxis. - will plan to stay till post partum day to monitor baby Contraception: had post placental IUD placed Feeding: breastfeeding Dispo: Plan for discharge tomorrow.   LOS: 1 day   Sheppard Evens MD MPH OB Fellow, Faculty Practice

## 2022-02-21 MED ORDER — IBUPROFEN 600 MG PO TABS: 600.0000 mg | ORAL_TABLET | Freq: Four times a day (QID) | ORAL | 0 refills | Status: DC

## 2022-02-21 NOTE — Lactation Note (Signed)
This note was copied from a baby's chart. Lactation Consultation Note  Patient Name: Amber Le [id number] Date: 02/21/2022 Reason for consult: Follow-up assessment Age:38 hours  [place] interpreter used via video. Reviewed engorgement care and monitoring voids/stools. Encouraged offering breast before formula. Feeding Mother's Current Feeding Choice: Breast Milk and Formula Nipple Type: Slow - flow  Interventions Interventions: Education  Discharge Discharge Education: Engorgement and breast care;Warning signs for feeding baby  Consult Status Consult Status: Complete Date: 02/21/22    Dahlia Byes [facility] 02/21/2022, 12:09 PM

## 2022-02-21 NOTE — Discharge Summary (Signed)
Postpartum Discharge Summary    Patient Name: Amber Le DOB: 10-10-1985 MRN: [id number]  Date of admission: 02/19/2022 Delivery date:02/19/2022  Delivering provider: Renee Harder  Date of discharge: 02/21/2022  Admitting diagnosis: Indication for care in labor or delivery [O75.9] Intrauterine pregnancy: [redacted]w[redacted]d    Secondary diagnosis:  Principal Problem:   Indication for care in labor or delivery  Additional problems: n/a    Discharge diagnosis: Term Pregnancy Delivered and GDM A1                                              Post partum procedures: PP IUD Augmentation: N/A Complications: None  Hospital course: Onset of Labor With Vaginal Delivery      36y.o. yo G3P3003 at 320w5das admitted in Active Labor on 02/19/2022. Patient had an uncomplicated labor course as follows:  Membrane Rupture Time/Date:  ,   Delivery Method:Vaginal, Spontaneous  Episiotomy: None  Lacerations:  None  Patient had an uncomplicated postpartum course.  She is ambulating, tolerating a regular diet, passing flatus, and urinating well. Patient is discharged home in stable condition on 02/21/22.  Newborn Data: Birth date:02/19/2022  Birth time:7:36 AM  Gender:Female  Living status:Living  Apgars:9 ,9  Weight:3570 g   Magnesium Sulfate received: No BMZ received: No Rhophylac:N/A MMR:N/A T-DaP:Given prenatally Flu: N/A Transfusion:No  Physical exam  Vitals:   02/20/22 0449 02/20/22 1418 02/21/22 0039 02/21/22 0553  BP: (!) 91/47 (!) 102/56 103/60 104/67  Pulse: 76 77 77 89  Resp: _0 Temp:  98 F (36.7 C) 97.9 F (36.6 C) 98 F (36.7 C)  TempSrc:  Oral Oral Oral  SpO2: 100%  99% 98%  Weight:      Height:       General: alert, cooperative, and no distress Lochia: appropriate Uterine Fundus: firm Incision: N/A DVT Evaluation: No evidence of DVT seen on physical exam. Labs: Lab Results  Component Value Date   WBC 11.3 (H) 02/20/2022   HGB 12.0 02/20/2022   HCT 35.7  (L) 02/20/2022   MCV 83.4 02/20/2022   PLT 193 02/20/2022       No data to display         Edinburgh Score:    02/19/2022   10:34 PM  Edinburgh Postnatal Depression Scale Screening Tool  I have been able to laugh and see the funny side of things. 0  I have looked forward with enjoyment to things. 0  I have blamed myself unnecessarily when things went wrong. 1  I have been anxious or worried for no good reason. 0  I have felt scared or panicky for no good reason. 0  Things have been getting on top of me. 0  I have been so unhappy that I have had difficulty sleeping. 1  I have felt sad or miserable. 0  I have been so unhappy that I have been crying. 0  The thought of harming myself has occurred to me. 0  Edinburgh Postnatal Depression Scale Total 2     After visit meds:  Allergies as of 02/21/2022       Reactions   Avocado Other (See Comments)   Throat itchiness        Medication List     STOP taking these medications    Accu-Chek Guide test strip Generic drug: glucose  blood   Accu-Chek Softclix Lancets lancets       TAKE these medications    ibuprofen 600 MG tablet Commonly known as: ADVIL Take 1 tablet (600 mg total) by mouth every 6 (six) hours.   WesTab Plus 27-1 MG Tabs Take 1 tablet by mouth daily.         Discharge home in stable condition Infant Feeding: Breast Infant Disposition:home with mother Discharge instruction: per After Visit Summary and Postpartum booklet. Activity: Advance as tolerated. Pelvic rest for 6 weeks.  Diet: carb modified diet Future Appointments: Future Appointments  Date Time Provider [place]  02/22/2022  1:35 PM Donnamae Jude, MD [facility]   Follow up Visit:  [place] [facility] [place] [facility] Follow up.   Specialty: Obstetrics and Gynecology Why: In 4 weeks for a postpartum appt Contact information: [place]  [id number] [phone]                 Please schedule this patient for a In person postpartum visit in 4 weeks with the following provider: Any provider. Additional Postpartum F/U:2 hour GTT  High risk pregnancy complicated by: GDM Delivery mode:  Vaginal, Spontaneous  Anticipated Birth Control:  PP IUD placed   02/21/2022 Wende Mott, CNM

## 2022-02-22 ENCOUNTER — Encounter: Payer: Medicaid Other | Admitting: Family Medicine

## 2022-02-28 ENCOUNTER — Telehealth (HOSPITAL_COMMUNITY): Payer: Self-pay | Admitting: *Deleted

## 2022-02-28 NOTE — Telephone Encounter (Signed)
Unable to contact via interpreter.  Duffy Rhody, RN 02-28-2022 at 9:57am

## 2022-03-06 ENCOUNTER — Telehealth: Payer: Self-pay

## 2022-03-07 NOTE — Telephone Encounter (Signed)
[facility] RN calls office and requests to speak with a nurse. Call transferred to RN. [facility] nurse states she is at the patient's home and patient is reporting that she cut her own IUD strings. Patient recently had vaginal delivery on 02/19/22. The patient told the nurse that the strings were hanging outside of vagina. Pt also reports small "ball" coming from vagina that she pushed back inside. Pt denies any pain or discomfort. Recommended patient come in for pelvic exam with provider as soon as possible. Appt scheduled for 03/08/22 with Alysia Penna, MD.

## 2022-03-08 ENCOUNTER — Ambulatory Visit (INDEPENDENT_AMBULATORY_CARE_PROVIDER_SITE_OTHER): Payer: Medicaid Other | Admitting: Obstetrics and Gynecology

## 2022-03-08 ENCOUNTER — Encounter: Payer: Self-pay | Admitting: Obstetrics and Gynecology

## 2022-03-08 ENCOUNTER — Other Ambulatory Visit: Payer: Self-pay

## 2022-03-08 DIAGNOSIS — Z30431 Encounter for routine checking of intrauterine contraceptive device: Secondary | ICD-10-CM | POA: Insufficient documentation

## 2022-03-08 NOTE — Progress Notes (Signed)
Ms Lundeen presents for IUD string check. Had IUD placed post placenta  Note about a week after discharge, string outside vagina. She cut the excess string  Denies any bowel or bladder dysfunction  PE AF VSS Chaperone present  Lungs clear Heart RRR Abd soft + BS GU Nl EGBUS, IUD string was cut @ 3 cm from cervix  A/P IUD check  Pt reassured. Instructed to continue with pelvic rest. Keep PP visit Live interrupter used during today's visit

## 2022-03-15 ENCOUNTER — Other Ambulatory Visit: Payer: Self-pay

## 2022-03-15 DIAGNOSIS — O0990 Supervision of high risk pregnancy, unspecified, unspecified trimester: Secondary | ICD-10-CM

## 2022-03-15 DIAGNOSIS — O24429 Gestational diabetes mellitus in childbirth, unspecified control: Secondary | ICD-10-CM

## 2022-03-21 NOTE — Addendum Note (Signed)
Addended by: Jill Side on: 03/21/2022 08:31 AM   Modules accepted: Orders

## 2022-03-22 ENCOUNTER — Other Ambulatory Visit: Payer: Medicaid Other

## 2022-03-22 ENCOUNTER — Ambulatory Visit (INDEPENDENT_AMBULATORY_CARE_PROVIDER_SITE_OTHER): Payer: Medicaid Other | Admitting: Family Medicine

## 2022-03-22 ENCOUNTER — Encounter: Payer: Self-pay | Admitting: Family Medicine

## 2022-03-22 DIAGNOSIS — Z975 Presence of (intrauterine) contraceptive device: Secondary | ICD-10-CM

## 2022-03-22 NOTE — Progress Notes (Signed)
Post Partum Visit Note  Amber Le is a 36 y.o. G76P3003 female who presents for a postpartum visit. She is 4 weeks postpartum following a normal spontaneous vaginal delivery.  I have fully reviewed the prenatal and intrapartum course. The delivery was at 38.5 gestational weeks.  Anesthesia: none. Postpartum course has been good. Baby is doing well. Baby is feeding by breast. Bleeding staining only. Bowel function is normal. Bladder function is normal. Patient is not sexually active. Contraception method is IUD. Postpartum depression screening: negative.   The pregnancy intention screening data noted above was reviewed. Potential methods of contraception were discussed. The patient elected to proceed with No data recorded.   Edinburgh Postnatal Depression Scale - 03/22/22 0916       Edinburgh Postnatal Depression Scale:  In the Past 7 Days   I have been able to laugh and see the funny side of things. 0    I have looked forward with enjoyment to things. 0    I have blamed myself unnecessarily when things went wrong. 0    I have been anxious or worried for no good reason. 0    I have felt scared or panicky for no good reason. 0    Things have been getting on top of me. 0    I have been so unhappy that I have had difficulty sleeping. 0    I have felt sad or miserable. 0    I have been so unhappy that I have been crying. 0    The thought of harming myself has occurred to me. 0    Edinburgh Postnatal Depression Scale Total 0             There are no preventive care reminders to display for this patient.   The following portions of the patient's history were reviewed and updated as appropriate: allergies, current medications, past family history, past medical history, past social history, past surgical history, and problem list.  Review of Systems Pertinent items are noted in HPI.  Objective:  BP 108/74   Pulse 71   Wt 140 lb 4.8 oz (63.6 kg)   LMP  (LMP Unknown)    Breastfeeding Yes   BMI 25.66 kg/m    General:  alert, cooperative, and appears stated age   Breasts:  not indicated  Lungs: clear to auscultation bilaterally  Heart:  regular rate and rhythm, S1, S2 normal, no murmur, click, rub or gallop  Abdomen: soft, non-tender; bowel sounds normal; no masses,  no organomegaly   Wound NA  GU exam:  not indicated       Assessment:   Normal postpartum exam.   Plan:   Essential components of care per ACOG recommendations:  1.  Mood and well being: Patient with negative depression screening today. Reviewed local resources for support.  - Patient tobacco use? No.   - hx of drug use? No.    2. Infant care and feeding:  -Patient currently breastmilk feeding? Yes. Reviewed importance of draining breast regularly to support lactation.  -Social determinants of health (SDOH) reviewed in EPIC. No concerns  3. Sexuality, contraception and birth spacing - Patient does not want a pregnancy in the next year.  Desired family size is 3 children.  - Reviewed reproductive life planning. Reviewed contraceptive methods based on pt preferences and effectiveness.  Patient desired IUD or IUS today.   - Discussed birth spacing of 18 months  4. Sleep and fatigue -Encouraged family/partner/community support of 4  hrs of uninterrupted sleep to help with mood and fatigue  5. Physical Recovery  - Discussed patients delivery and complications. She describes her labor as good. - Patient had a Vaginal, no problems at delivery. Patient had a  no  laceration. Perineal healing reviewed. Patient expressed understanding - Patient has urinary incontinence? No. - Patient is safe to resume physical and sexual activity  6.  Health Maintenance - HM due items addressed Yes - Last pap smear - No results in our record-- NOTED AFTER patient left.  Sent message to clinical team to call patient about having this performed.  -Breast Cancer screening indicated? No.   7. Chronic  Disease/Pregnancy Condition follow up: None  - PCP follow up  Federico Flake, MD Center for [facility], [facility]

## 2022-03-27 ENCOUNTER — Telehealth: Payer: Self-pay | Admitting: General Practice

## 2022-03-27 NOTE — Telephone Encounter (Signed)
Called patient with [employer] [phone] to inform her of appt tomorrow for pap smear- no answer. Left message to call us back

## 2022-03-28 ENCOUNTER — Ambulatory Visit (INDEPENDENT_AMBULATORY_CARE_PROVIDER_SITE_OTHER): Payer: Medicaid Other | Admitting: Family Medicine

## 2022-03-28 ENCOUNTER — Encounter: Payer: Self-pay | Admitting: Family Medicine

## 2022-03-28 ENCOUNTER — Other Ambulatory Visit (HOSPITAL_COMMUNITY)
Admission: RE | Admit: 2022-03-28 | Discharge: 2022-03-28 | Disposition: A | Discharge status: Home / Self Care | Payer: Medicaid Other | Source: Ambulatory Visit | Attending: Family Medicine | Admitting: Family Medicine

## 2022-03-28 ENCOUNTER — Other Ambulatory Visit: Payer: Medicaid Other

## 2022-03-28 ENCOUNTER — Other Ambulatory Visit: Payer: Self-pay

## 2022-03-28 VITALS — BP 108/75 | HR 80 | Ht 62.0 in | Wt 138.2 lb

## 2022-03-28 DIAGNOSIS — Z124 Encounter for screening for malignant neoplasm of cervix: Secondary | ICD-10-CM

## 2022-03-28 DIAGNOSIS — O24429 Gestational diabetes mellitus in childbirth, unspecified control: Secondary | ICD-10-CM

## 2022-03-28 NOTE — Progress Notes (Signed)
   GYNECOLOGY PROBLEM  VISIT ENCOUNTER NOTE  Subjective:   Amber Le is a 36 y.o. G59P3003 female here for a problem GYN visit.  Current complaints: needs pap/desires pap   Denies abnormal vaginal bleeding, discharge, pelvic pain, problems with intercourse or other gynecologic concerns.    Gynecologic History Patient's last menstrual period was 03/22/2022 (exact date).  Contraception: IUD  There are no preventive care reminders to display for this patient.  The following portions of the patient's history were reviewed and updated as appropriate: allergies, current medications, past family history, past medical history, past social history, past surgical history and problem list.  Review of Systems Pertinent items are noted in HPI.   Objective:  BP 108/75   Pulse 80   Ht 5\' 2"  (1.575 m)   Wt 138 lb 3.2 oz (62.7 kg)   LMP 03/22/2022 (Exact Date)   Breastfeeding No   BMI 25.28 kg/m  Gen: well appearing, NAD HEENT: no scleral icterus CV: RR Lung: Normal WOB Ext: warm well perfused  PELVIC: Normal appearing external genitalia; normal appearing vaginal mucosa and cervix.  No abnormal discharge noted.  Pap smear obtained.  Normal uterine size, no other palpable masses, no uterine or adnexal tenderness.   Assessment and Plan:  1. Cervical cancer screening - Cytology - PAP( [place])   Please refer to After Visit Summary for other counseling recommendations.   No follow-ups on file.  03/24/2022, MD, MPH, ABFM Attending Physician Faculty Practice- Center for [facility]

## 2022-03-29 LAB — GLUCOSE TOLERANCE, 2 HOURS
Glucose, 2 hour: 103 mg/dL (ref 70–139)
Glucose, GTT - Fasting: 72 mg/dL (ref 70–99)

## 2022-04-02 LAB — CYTOLOGY - PAP
Comment: NEGATIVE
Diagnosis: NEGATIVE
High risk HPV: NEGATIVE

## 2022-04-03 ENCOUNTER — Other Ambulatory Visit: Payer: Medicaid Other

## 2022-04-04 ENCOUNTER — Telehealth: Payer: Self-pay | Admitting: *Deleted

## 2022-04-04 NOTE — Telephone Encounter (Signed)
I called patient with [name] and informed her results /information per Dr. Alvester Morin. She voices understanding. She also asked if she has to pay bill. I explained we do not handle the billing, but I encouraged her to call the phone number on the bill and give them her medicaid information and they will help her.  Amber Le

## 2022-04-04 NOTE — Telephone Encounter (Signed)
-----   Message from Federico Flake, MD sent at 03/29/2022 10:38 AM EDT ----- Diabetes has resolved

## 2023-02-26 IMAGING — US US MFM OB DETAIL+14 WK
1 series · 13 of 28 positions shown · non-contrast
Comparison: none

[Series 1: us mfm ob detail+14 wk · 13 of 116 slices shown]
[im 5/116]
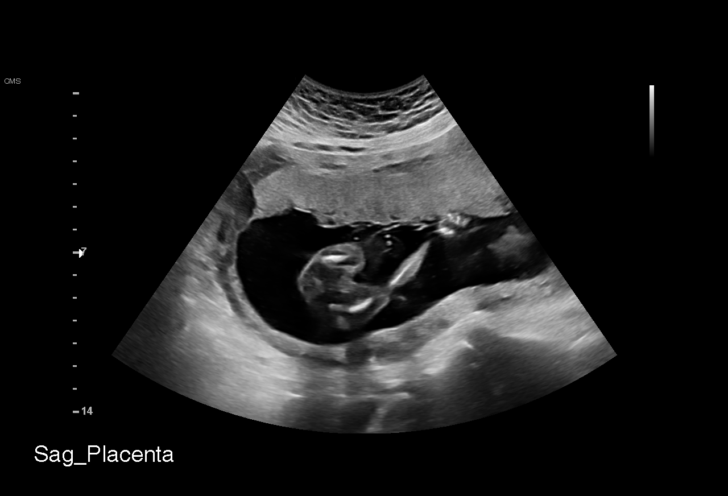
[im 13/116]
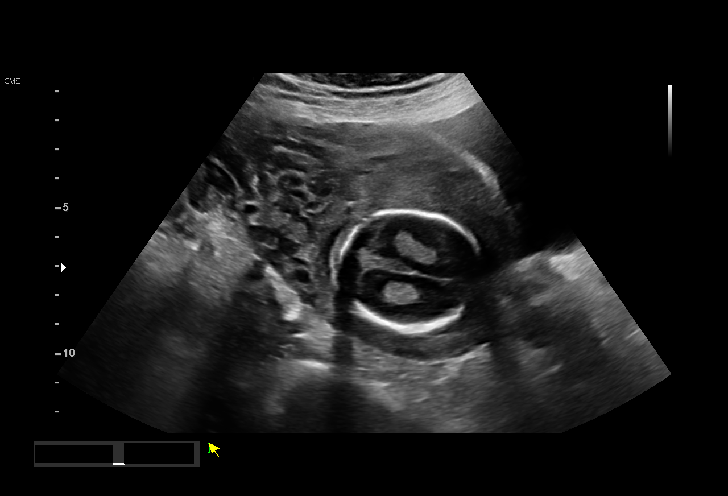
[im 22/116]
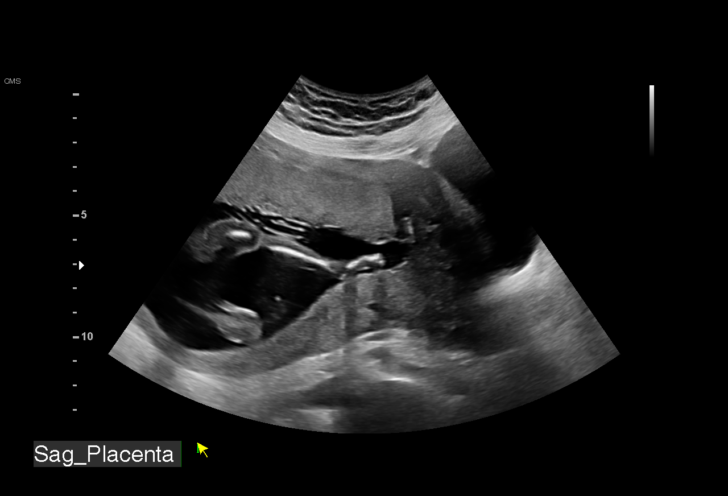
[im 30/116]
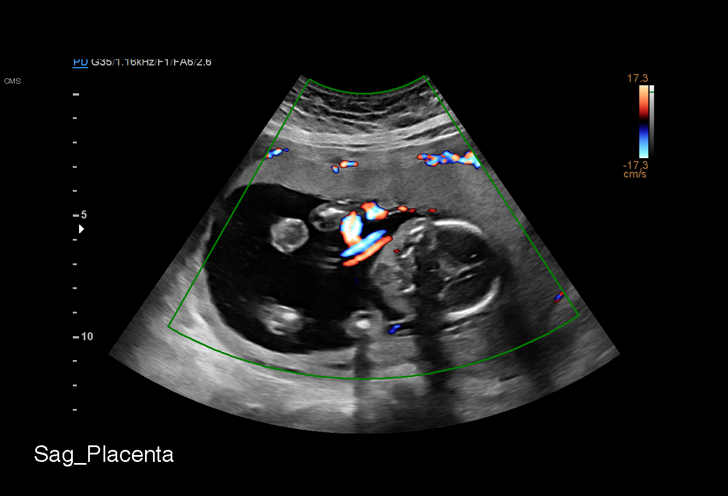
[im 39/116]
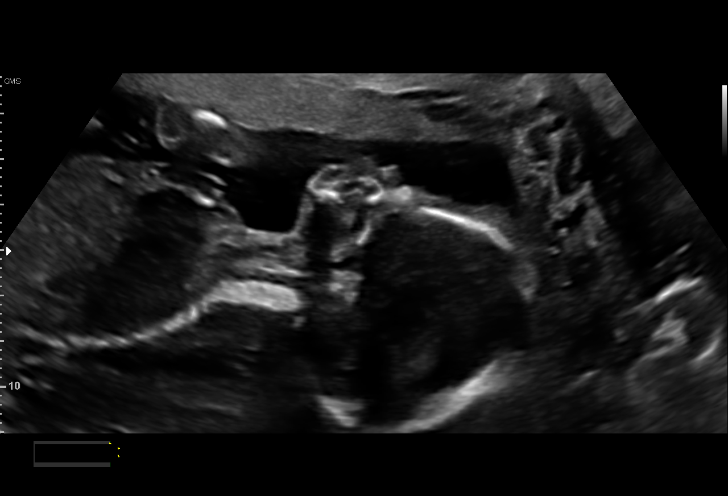
[im 47/116]
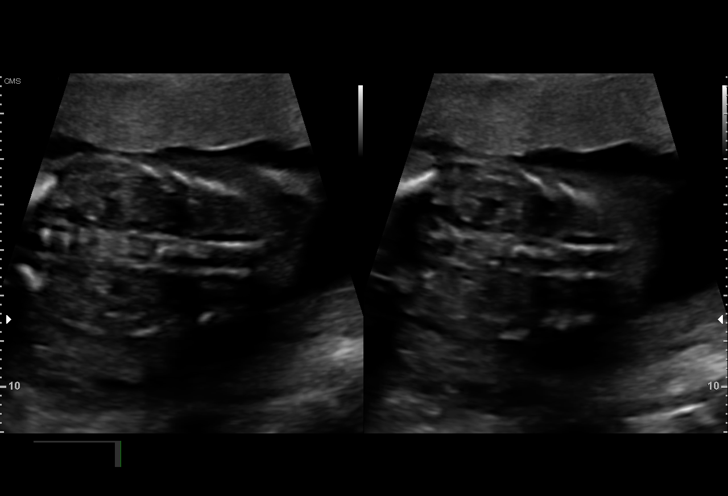
[im 60/116]
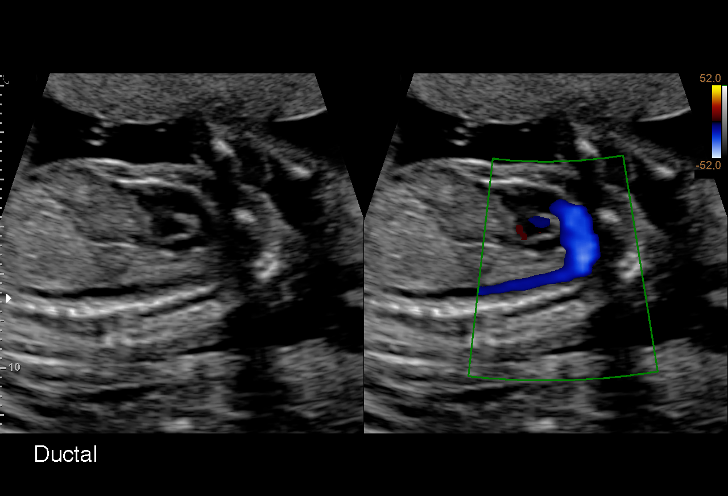
[im 69/116]
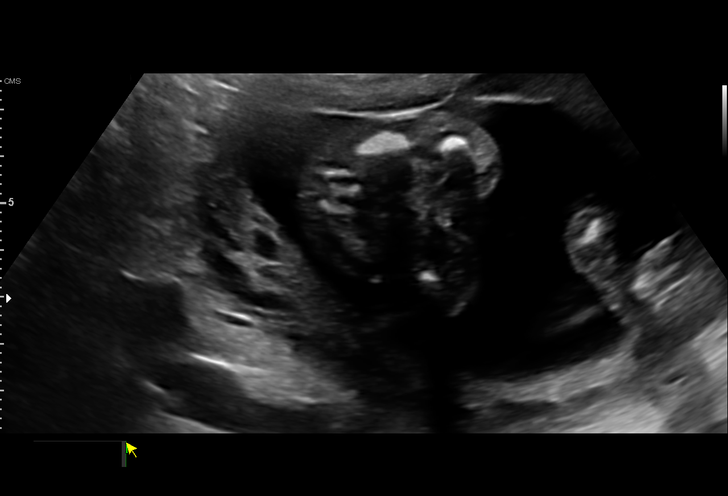
[im 77/116]
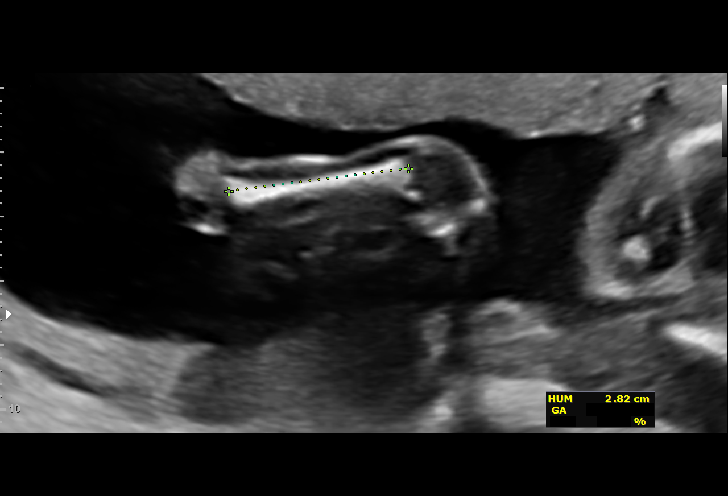
[im 86/116]
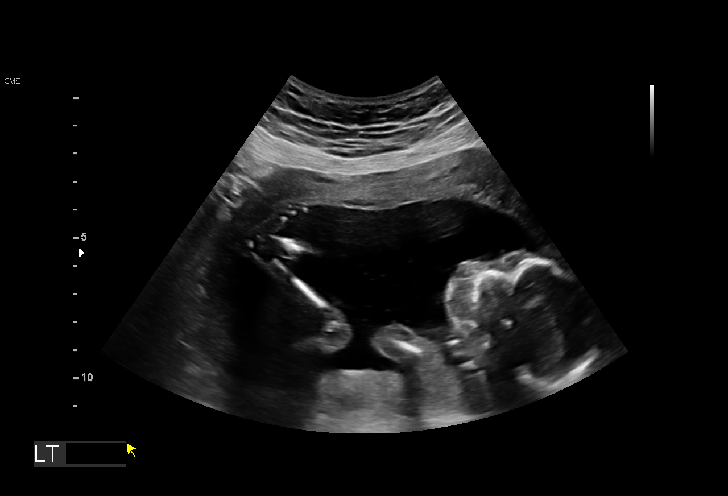
[im 94/116]
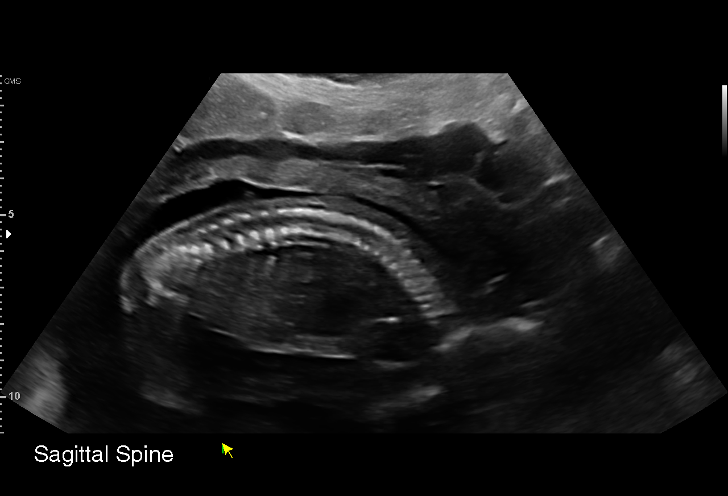
[im 103/116]
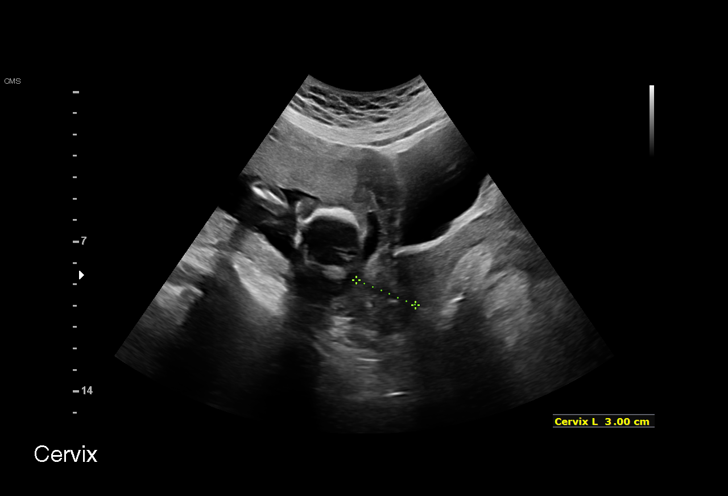
[im 111/116]
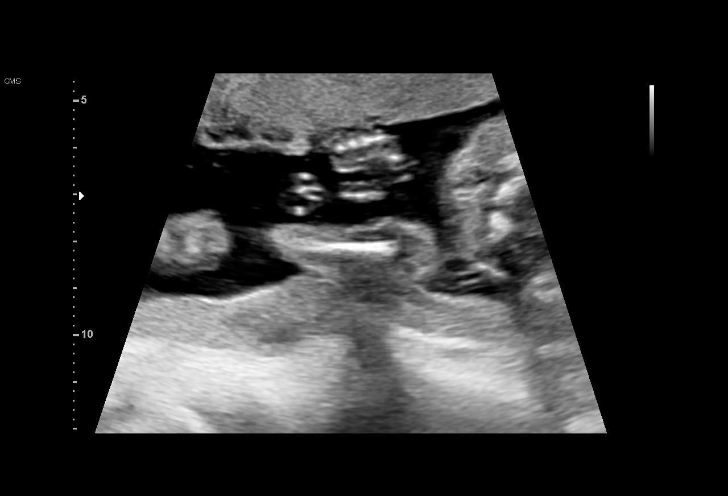

[13 of 28 positions shown; findings below may reference images not displayed]

[REDACTED]-
                   Faculty Physician

Indications

 19 weeks gestation of pregnancy
 Antenatal screening for malformations
 Advanced maternal age multigravida 35,
 second trimester
Fetal Evaluation

 Num Of Fetuses:         1
 Fetal Heart Rate(bpm):  158
 Cardiac Activity:       Observed
 Presentation:           Cephalic
 Placenta:               Anterior
 P. Cord Insertion:      Visualized

 Amniotic Fluid
 AFI FV:      Within normal limits

                             Largest Pocket(cm)

Biometry

 BPD:      41.1  mm     G. Age:  18w 3d         27  %    CI:        69.78   %    70 - 86
                                                         FL/HC:      18.4   %    16.1 -
 HC:       157   mm     G. Age:  18w 4d         24  %    HC/AC:      1.09        1.09 -
 AC:      144.7  mm     G. Age:  19w 6d         72  %    FL/BPD:     70.3   %
 FL:       28.9  mm     G. Age:  18w 6d         39  %    FL/AC:      20.0   %    20 - 24
 HUM:      28.5  mm     G. Age:  19w 1d         56  %
 CER:      20.2  mm     G. Age:  19w 3d         57  %
 NFT:       3.3  mm

 LV:        6.5  mm
 CM:        4.5  mm

 Est. FW:     283  gm    0 lb 10 oz      62  %
OB History

 Gravidity:    3         Term:   2        Prem:   0        SAB:   0
 TOP:          0       Ectopic:  0        Living: 2
Gestational Age

 LMP:           22w 2d        Date:  05/01/21                 EDD:   02/05/22
 U/S Today:     19w 0d                                        EDD:   02/28/22
 Best:          19w 0d     Det. By:  Previous Ultrasound      EDD:   02/28/22
                                     (09/11/21)
Anatomy

 Cranium:               Appears normal         LVOT:                   Appears normal
 Cavum:                 Appears normal         Aortic Arch:            Appears normal
 Ventricles:            Appears normal         Ductal Arch:            Appears normal
 Choroid Plexus:        Appears normal         Diaphragm:              Appears normal
 Cerebellum:            Appears normal         Stomach:                Appears normal, left
                                                                       sided
 Posterior Fossa:       Appears normal         Abdomen:                Appears normal
 Nuchal Fold:           Appears normal         Abdominal Wall:         Appears nml (cord
                                                                       insert, abd wall)
 Face:                  Appears normal         Cord Vessels:           Appears normal (3
                        (orbits and profile)                           vessel cord)
 Lips:                  Appears normal         Kidneys:                Appear normal
 Palate:                Appears normal         Bladder:                Appears normal
 Thoracic:              Appears normal         Spine:                  Appears normal
 Heart:                 Not well visualized    Upper Extremities:      Appears normal
 RVOT:                  Appears normal         Lower Extremities:      Appears normal

 Other:  3VV, 3VTV, VC, heels/feet, nasal bone and lenses visualized. Fetus
         appears to be female. Technically difficult due to fetal position.
Cervix Uterus Adnexa

 Cervix
 Length:           2.93  cm.
 Normal appearance by transabdominal scan.

 Uterus
 No abnormality visualized.

 Right Ovary
 Not visualized.

 Left Ovary
 Not visualized.
 Cul De Sac
 No free fluid seen.

 Adnexa
 No abnormality visualized.
Impression

 Single intrauterine pregnancy here for a detailed anatomy
 due to advanced maternal age.
 Normal anatomy with measurements consistent with dates
 There is good fetal movement and amniotic fluid volume
 Suboptimal views of the fetal anatomy due to fetal position.
Recommendations

 Follow up as clinically indicated.

## 2023-03-20 ENCOUNTER — Ambulatory Visit (INDEPENDENT_AMBULATORY_CARE_PROVIDER_SITE_OTHER): Payer: Self-pay | Admitting: Obstetrics & Gynecology

## 2023-03-20 ENCOUNTER — Encounter: Payer: Self-pay | Admitting: Obstetrics & Gynecology

## 2023-03-20 VITALS — BP 112/78 | HR 81 | Wt 142.3 lb

## 2023-03-20 DIAGNOSIS — Z30432 Encounter for removal of intrauterine contraceptive device: Secondary | ICD-10-CM

## 2023-03-20 NOTE — Patient Instructions (Signed)
Use a spermacide with nonoxynol-9 in it with the condom

## 2023-03-20 NOTE — Progress Notes (Signed)
    GYNECOLOGY OFFICE PROCEDURE NOTE  Amber Le is a 37 y.o. G3P3003 here for IUD removal. No GYN concerns.  Last pap smear was on 03/2022 and was normal.  IUD Removal  Patient identified, informed consent performed, consent signed.   Chaperone present.  Patient was placed in the dorsal lithotomy position, normal external genitalia was noted.  A speculum was placed in the patient's vagina, normal discharge was noted, no lesions. The cervix was visualized, no lesions, no abnormal discharge.  The strings of the IUD were grasped and pulled using ring forceps. The IUD was removed in its entirety. Patient tolerated the procedure well.    Patient will use condoms for contraception.  Routine preventative health maintenance measures emphasized.   Adam Phenix, MD Obstetrician & Gynecologist, [facility] for [facility], [facility]

## 2024-02-25 ENCOUNTER — Emergency Department (HOSPITAL_COMMUNITY)
Admission: EM | Admit: 2024-02-25 | Discharge: 2024-02-25 | Disposition: A | Discharge status: Home / Self Care | Payer: Self-pay

## 2024-02-25 ENCOUNTER — Encounter (HOSPITAL_COMMUNITY): Payer: Self-pay | Admitting: Emergency Medicine

## 2024-02-25 ENCOUNTER — Emergency Department (HOSPITAL_COMMUNITY): Payer: Self-pay

## 2024-02-25 DIAGNOSIS — N3001 Acute cystitis with hematuria: Secondary | ICD-10-CM | POA: Insufficient documentation

## 2024-02-25 LAB — CBC WITH DIFFERENTIAL/PLATELET
Abs Immature Granulocytes: 0.04 K/uL (ref 0.00–0.07)
Basophils Absolute: 0 K/uL (ref 0.0–0.1)
Basophils Relative: 0 %
Eosinophils Absolute: 0.2 K/uL (ref 0.0–0.5)
Eosinophils Relative: 2 %
HCT: 41.3 % (ref 36.0–46.0)
Hemoglobin: 13.3 g/dL (ref 12.0–15.0)
Immature Granulocytes: 0 %
Lymphocytes Relative: 15 %
Lymphs Abs: 1.4 K/uL (ref 0.7–4.0)
MCH: 28.6 pg (ref 26.0–34.0)
MCHC: 32.2 g/dL (ref 30.0–36.0)
MCV: 88.8 fL (ref 80.0–100.0)
Monocytes Absolute: 0.4 K/uL (ref 0.1–1.0)
Monocytes Relative: 5 %
Neutro Abs: 7.5 K/uL (ref 1.7–7.7)
Neutrophils Relative %: 78 %
Platelets: 254 K/uL (ref 150–400)
RBC: 4.65 MIL/uL (ref 3.87–5.11)
RDW: 12.3 % (ref 11.5–15.5)
WBC: 9.6 K/uL (ref 4.0–10.5)
nRBC: 0 % (ref 0.0–0.2)

## 2024-02-25 LAB — URINALYSIS, ROUTINE W REFLEX MICROSCOPIC
Bilirubin Urine: NEGATIVE
Glucose, UA: NEGATIVE mg/dL
Ketones, ur: NEGATIVE mg/dL
Nitrite: NEGATIVE
Protein, ur: 30 mg/dL — AB
RBC / HPF: >50 RBC/hpf (ref 0–5)
Specific Gravity, Urine: 1.024 (ref 1.005–1.030)
WBC, UA: >50 WBC/hpf (ref 0–5)
pH: 6 (ref 5.0–8.0)

## 2024-02-25 LAB — COMPREHENSIVE METABOLIC PANEL WITH GFR
ALT: 15 U/L (ref 0–44)
AST: 18 U/L (ref 15–41)
Albumin: 3.5 g/dL (ref 3.5–5.0)
Alkaline Phosphatase: 33 U/L — ABNORMAL LOW (ref 38–126)
Anion gap: 7 (ref 5–15)
BUN: 16 mg/dL (ref 6–20)
CO2: 24 mmol/L (ref 22–32)
Calcium: 8.5 mg/dL — ABNORMAL LOW (ref 8.9–10.3)
Chloride: 106 mmol/L (ref 98–111)
Creatinine, Ser: 0.67 mg/dL (ref 0.44–1.00)
GFR, Estimated: >60 mL/min (ref >60)
Glucose, Bld: 89 mg/dL (ref 70–99)
Potassium: 4 mmol/L (ref 3.5–5.1)
Sodium: 137 mmol/L (ref 135–145)
Total Bilirubin: 0.5 mg/dL (ref 0.0–1.2)
Total Protein: 6.6 g/dL (ref 6.5–8.1)

## 2024-02-25 LAB — POC URINE PREG, ED: Preg Test, Ur: NEGATIVE

## 2024-02-25 MED ORDER — SODIUM CHLORIDE 0.9 % IV SOLN
1.0000 g | Freq: Once | INTRAVENOUS | Status: AC
  Administered 2024-02-25: 1 g via INTRAVENOUS
  Filled 2024-02-25: qty 10

## 2024-02-25 MED ORDER — SODIUM CHLORIDE 0.9 % IV BOLUS
1000.0000 mL | Freq: Once | INTRAVENOUS | Status: AC
  Administered 2024-02-25: 1000 mL via INTRAVENOUS

## 2024-02-25 MED ORDER — CEPHALEXIN 500 MG PO CAPS: 500.0000 mg | ORAL_CAPSULE | Freq: Three times a day (TID) | ORAL | 0 refills | Status: AC

## 2024-02-25 NOTE — ED Provider Notes (Signed)
 [place] EMERGENCY DEPARTMENT AT [facility] Provider Note   CSN: [id number] Arrival date & time: 02/25/24  9660     Patient presents with: Hematuria   Amber Le is a 38 y.o. female.   38 year old female presents today for concern hematuria that started this morning.  She states she had some difficulty voiding in the past couple days but the hematuria started today.  No nausea, vomiting, or abdominal pain.  Denies history of kidney stones.  The history is provided by the patient. No language interpreter was used.       Prior to Admission medications   Medication Sig Start Date End Date Taking? Authorizing Provider  Prenatal Vit-Fe Fumarate-FA (WESTAB PLUS) 27-1 MG TABS Take 1 tablet by mouth daily. 09/07/21   [provider]    Allergies: Avocado    Review of Systems  Constitutional:  Negative for chills and fever.  Respiratory:  Negative for shortness of breath.   Cardiovascular:  Negative for chest pain.  Gastrointestinal:  Negative for abdominal pain.  Genitourinary:  Positive for dysuria and hematuria. Negative for flank pain.  Neurological:  Negative for light-headedness.  All other systems reviewed and are negative.   Updated Vital Signs BP 110/76 (BP Location: Right Arm)   Pulse 82   Temp 98.6 F (37 C)   Resp 16   LMP 02/12/2024   SpO2 99%   Physical Exam Vitals and nursing note reviewed.  Constitutional:      General: She is not in acute distress.    Appearance: Normal appearance. She is not ill-appearing.  HENT:     Head: Normocephalic and atraumatic.     Nose: Nose normal.  Eyes:     Conjunctiva/sclera: Conjunctivae normal.  Cardiovascular:     Rate and Rhythm: Normal rate and regular rhythm.  Pulmonary:     Effort: Pulmonary effort is normal. No respiratory distress.     Breath sounds: Normal breath sounds. No wheezing.  Abdominal:     General: There is no distension.     Tenderness: There is no abdominal tenderness. There  is no right CVA tenderness, left CVA tenderness or guarding.  Musculoskeletal:        General: No deformity. Normal range of motion.     Cervical back: Normal range of motion.  Skin:    Findings: No rash.  Neurological:     Mental Status: She is alert.     (all labs ordered are listed, but only abnormal results are displayed) Labs Reviewed  URINALYSIS, ROUTINE W REFLEX MICROSCOPIC - Abnormal; Notable for the following components:      Result Value   APPearance CLOUDY (*)    Hgb urine dipstick LARGE (*)    Protein, ur 30 (*)    Leukocytes,Ua LARGE (*)    Bacteria, UA FEW (*)    All other components within normal limits  CBC WITH DIFFERENTIAL/PLATELET  COMPREHENSIVE METABOLIC PANEL WITH GFR  POC URINE PREG, ED    EKG: None  Radiology: No results found.   Procedures   Medications Ordered in the ED  sodium chloride  0.9 % bolus 1,000 mL (has no administration in time range)                                    Medical Decision Making Amount and/or Complexity of Data Reviewed Labs: ordered. Radiology: ordered.  Risk Prescription drug management.   Medical Decision  Making / ED Course   This patient presents to the ED for concern of hematuria, urinary frequency, this involves an extensive number of treatment options, and is a complaint that carries with it a high risk of complications and morbidity.  The differential diagnosis includes nephrolithiasis, pyelonephritis, urinary tract infection  MDM: 38 year old female presents today for concern of hematuria that started this morning.  Has some urinary difficulty in the past couple days.  No other abdominal pain or symptoms.  No CVA tenderness on exam.  UA with evidence of UTI.  Will obtain labs, obtain CT renal stone study and provide fluids.  CBC unremarkable, CMP with preserved renal function, normal electrolytes.  CT renal stone study with no evidence of microlithiasis, pyelonephritis or other acute  concern.  Rocephin  given. Keflex  prescribed  Discharged in stable condition.  Return precaution discussed.  Patient voices understanding and is in agreement with plan.  Lab Tests: -I ordered, reviewed, and interpreted labs.   The pertinent results include:   Labs Reviewed  URINALYSIS, ROUTINE W REFLEX MICROSCOPIC - Abnormal; Notable for the following components:      Result Value   APPearance CLOUDY (*)    Hgb urine dipstick LARGE (*)    Protein, ur 30 (*)    Leukocytes,Ua LARGE (*)    Bacteria, UA FEW (*)    All other components within normal limits  COMPREHENSIVE METABOLIC PANEL WITH GFR - Abnormal; Notable for the following components:   Calcium 8.5 (*)    Alkaline Phosphatase 33 (*)    All other components within normal limits  CBC WITH DIFFERENTIAL/PLATELET  POC URINE PREG, ED      EKG  EKG Interpretation Date/Time:    Ventricular Rate:    PR Interval:    QRS Duration:    QT Interval:    QTC Calculation:   R Axis:      Text Interpretation:           Imaging Studies ordered: I ordered imaging studies including ct renal stone  I independently visualized and interpreted imaging. I agree with the radiologist interpretation   Medicines ordered and prescription drug management: Meds ordered this encounter  Medications   sodium chloride  0.9 % bolus 1,000 mL   cefTRIAXone  (ROCEPHIN ) 1 g in sodium chloride  0.9 % 100 mL IVPB    Antibiotic Indication::   UTI   cephALEXin  (KEFLEX ) 500 MG capsule    Sig: Take 1 capsule (500 mg total) by mouth 3 (three) times daily for 7 days.    Dispense:  21 capsule    Refill:  0    Supervising Provider:   CLEOTILDE, BRIAN [[id number]]    -I have reviewed the patients home medicines and have made adjustments as needed   Reevaluation: After the interventions noted above, I reevaluated the patient and found that they have :improved  Co morbidities that complicate the patient evaluation  Past Medical History:  Diagnosis Date    Medical history non-contributory       Dispostion: Discharged in stable condition.  Return precaution discussed.  Patient voices understanding and is in agreement with plan.   Final diagnoses:  Acute cystitis with hematuria    ED Discharge Orders          Ordered    cephALEXin  (KEFLEX ) 500 MG capsule  3 times daily        02/25/24 0940               Hildegard Loge, PA-C 02/25/24 [phone]  Ula Prentice SAUNDERS, MD 02/25/24 [phone]

## 2024-02-25 NOTE — ED Triage Notes (Signed)
 Issues starting tonight of feeling like still having to void and blood in urine.

## 2024-02-25 NOTE — Discharge Instructions (Signed)
 You have a urinary tract infection.  You received a dose of antibiotic in the emergency department.  Additional antibiotic sent into your pharmacy.  CT scan did not show any kidney stone or kidney infection.  Remainder of the blood work was reassuring.  Follow-up with your primary care doctor.  If you do not have 1 establish with [facility].
